# Patient Record
Sex: Male | Born: 1960 | Race: White | Hispanic: No | Marital: Married | Smoking: Never smoker
Health system: Southern US, Community
[De-identification: ages and names within clinical notes are randomized; demographics above are authoritative.]

## PROBLEM LIST (undated history)

## (undated) ENCOUNTER — Encounter

## (undated) ENCOUNTER — Telehealth

## (undated) ENCOUNTER — Ambulatory Visit

## (undated) ENCOUNTER — Encounter: Attending: Internal Medicine | Primary: Internal Medicine

## (undated) ENCOUNTER — Ambulatory Visit: Payer: PRIVATE HEALTH INSURANCE

## (undated) ENCOUNTER — Encounter: Attending: Oncology | Primary: Oncology

## (undated) ENCOUNTER — Encounter: Attending: Registered" | Primary: Registered"

## (undated) ENCOUNTER — Telehealth: Attending: Internal Medicine | Primary: Internal Medicine

## (undated) ENCOUNTER — Encounter: Payer: PRIVATE HEALTH INSURANCE | Attending: Audiologist | Primary: Audiologist

## (undated) ENCOUNTER — Ambulatory Visit: Payer: PRIVATE HEALTH INSURANCE | Attending: Registered" | Primary: Registered"

## (undated) ENCOUNTER — Telehealth: Attending: Hospitalist | Primary: Hospitalist

## (undated) ENCOUNTER — Inpatient Hospital Stay: Payer: PRIVATE HEALTH INSURANCE

## (undated) ENCOUNTER — Encounter: Payer: PRIVATE HEALTH INSURANCE | Attending: Internal Medicine | Primary: Internal Medicine

## (undated) ENCOUNTER — Inpatient Hospital Stay

## (undated) DIAGNOSIS — C801 Malignant (primary) neoplasm, unspecified: Secondary | ICD-10-CM

## (undated) DIAGNOSIS — K8020 Calculus of gallbladder without cholecystitis without obstruction: Secondary | ICD-10-CM

## (undated) MED ORDER — LORATADINE 10 MG TABLET: 0 days

---

## 2016-12-21 DIAGNOSIS — J069 Acute upper respiratory infection, unspecified: Secondary | ICD-10-CM | POA: Diagnosis not present

## 2016-12-21 DIAGNOSIS — R05 Cough: Secondary | ICD-10-CM | POA: Diagnosis not present

## 2017-02-21 DIAGNOSIS — I491 Atrial premature depolarization: Secondary | ICD-10-CM | POA: Diagnosis not present

## 2017-02-21 DIAGNOSIS — Z1389 Encounter for screening for other disorder: Secondary | ICD-10-CM | POA: Diagnosis not present

## 2018-03-31 DIAGNOSIS — J302 Other seasonal allergic rhinitis: Secondary | ICD-10-CM | POA: Diagnosis not present

## 2018-08-31 DIAGNOSIS — R1084 Generalized abdominal pain: Secondary | ICD-10-CM | POA: Diagnosis not present

## 2020-08-01 ENCOUNTER — Inpatient Hospital Stay (HOSPITAL_COMMUNITY)
Admission: EM | Admit: 2020-08-01 | Discharge: 2020-08-02 | DRG: 375 | Disposition: A | Discharge status: Other Acute Inpatient Hospital | Payer: BC Managed Care – PPO | Attending: Internal Medicine | Admitting: Internal Medicine

## 2020-08-01 ENCOUNTER — Emergency Department (HOSPITAL_COMMUNITY): Payer: BC Managed Care – PPO

## 2020-08-01 ENCOUNTER — Other Ambulatory Visit: Payer: Self-pay

## 2020-08-01 ENCOUNTER — Inpatient Hospital Stay (HOSPITAL_COMMUNITY): Payer: BC Managed Care – PPO

## 2020-08-01 ENCOUNTER — Encounter (HOSPITAL_COMMUNITY): Payer: Self-pay | Admitting: Emergency Medicine

## 2020-08-01 DIAGNOSIS — K589 Irritable bowel syndrome without diarrhea: Secondary | ICD-10-CM | POA: Diagnosis present

## 2020-08-01 DIAGNOSIS — K828 Other specified diseases of gallbladder: Secondary | ICD-10-CM | POA: Diagnosis present

## 2020-08-01 DIAGNOSIS — C189 Malignant neoplasm of colon, unspecified: Secondary | ICD-10-CM

## 2020-08-01 DIAGNOSIS — E860 Dehydration: Secondary | ICD-10-CM | POA: Diagnosis present

## 2020-08-01 DIAGNOSIS — K56609 Unspecified intestinal obstruction, unspecified as to partial versus complete obstruction: Secondary | ICD-10-CM | POA: Diagnosis present

## 2020-08-01 DIAGNOSIS — C7800 Secondary malignant neoplasm of unspecified lung: Secondary | ICD-10-CM | POA: Diagnosis present

## 2020-08-01 DIAGNOSIS — F419 Anxiety disorder, unspecified: Secondary | ICD-10-CM | POA: Diagnosis present

## 2020-08-01 DIAGNOSIS — Z79899 Other long term (current) drug therapy: Secondary | ICD-10-CM | POA: Diagnosis not present

## 2020-08-01 DIAGNOSIS — N179 Acute kidney failure, unspecified: Secondary | ICD-10-CM | POA: Diagnosis present

## 2020-08-01 DIAGNOSIS — Z20822 Contact with and (suspected) exposure to covid-19: Secondary | ICD-10-CM | POA: Diagnosis present

## 2020-08-01 DIAGNOSIS — I10 Essential (primary) hypertension: Secondary | ICD-10-CM | POA: Diagnosis present

## 2020-08-01 DIAGNOSIS — R590 Localized enlarged lymph nodes: Secondary | ICD-10-CM | POA: Diagnosis present

## 2020-08-01 DIAGNOSIS — K567 Ileus, unspecified: Secondary | ICD-10-CM

## 2020-08-01 DIAGNOSIS — C787 Secondary malignant neoplasm of liver and intrahepatic bile duct: Secondary | ICD-10-CM | POA: Diagnosis present

## 2020-08-01 HISTORY — DX: Calculus of gallbladder without cholecystitis without obstruction: K80.20

## 2020-08-01 HISTORY — DX: Malignant (primary) neoplasm, unspecified: C80.1

## 2020-08-01 LAB — CBC
HCT: 38.7 % — ABNORMAL LOW (ref 39.0–52.0)
Hemoglobin: 12.2 g/dL — ABNORMAL LOW (ref 13.0–17.0)
MCH: 27.9 pg (ref 26.0–34.0)
MCHC: 31.5 g/dL (ref 30.0–36.0)
MCV: 88.6 fL (ref 80.0–100.0)
Platelets: 674 10*3/uL — ABNORMAL HIGH (ref 150–400)
RBC: 4.37 MIL/uL (ref 4.22–5.81)
RDW: 13.8 % (ref 11.5–15.5)
WBC: 14.8 10*3/uL — ABNORMAL HIGH (ref 4.0–10.5)
nRBC: 0 % (ref 0.0–0.2)

## 2020-08-01 LAB — BASIC METABOLIC PANEL
Anion gap: 14 (ref 5–15)
BUN: 31 mg/dL — ABNORMAL HIGH (ref 6–20)
CO2: 26 mmol/L (ref 22–32)
Calcium: 8.9 mg/dL (ref 8.9–10.3)
Chloride: 93 mmol/L — ABNORMAL LOW (ref 98–111)
Creatinine, Ser: 1.18 mg/dL (ref 0.61–1.24)
GFR calc Af Amer: >60 mL/min (ref >60)
GFR calc non Af Amer: >60 mL/min (ref >60)
Glucose, Bld: 117 mg/dL — ABNORMAL HIGH (ref 70–99)
Potassium: 4.9 mmol/L (ref 3.5–5.1)
Sodium: 133 mmol/L — ABNORMAL LOW (ref 135–145)

## 2020-08-01 LAB — URINALYSIS, ROUTINE W REFLEX MICROSCOPIC
Glucose, UA: 100 mg/dL — AB
Hgb urine dipstick: NEGATIVE
Ketones, ur: 15 mg/dL — AB
Leukocytes,Ua: NEGATIVE
Nitrite: POSITIVE — AB
Protein, ur: 30 mg/dL — AB
Specific Gravity, Urine: 1.025 (ref 1.005–1.030)
pH: 5.5 (ref 5.0–8.0)

## 2020-08-01 LAB — COMPREHENSIVE METABOLIC PANEL
ALT: 27 U/L (ref 0–44)
AST: 39 U/L (ref 15–41)
Albumin: 2.5 g/dL — ABNORMAL LOW (ref 3.5–5.0)
Alkaline Phosphatase: 174 U/L — ABNORMAL HIGH (ref 38–126)
Anion gap: 18 — ABNORMAL HIGH (ref 5–15)
BUN: 44 mg/dL — ABNORMAL HIGH (ref 6–20)
CO2: 20 mmol/L — ABNORMAL LOW (ref 22–32)
Calcium: 8.5 mg/dL — ABNORMAL LOW (ref 8.9–10.3)
Chloride: 98 mmol/L (ref 98–111)
Creatinine, Ser: 1.29 mg/dL — ABNORMAL HIGH (ref 0.61–1.24)
GFR calc Af Amer: >60 mL/min (ref >60)
GFR calc non Af Amer: >60 mL/min (ref >60)
Glucose, Bld: 111 mg/dL — ABNORMAL HIGH (ref 70–99)
Potassium: 4.8 mmol/L (ref 3.5–5.1)
Sodium: 136 mmol/L (ref 135–145)
Total Bilirubin: 1.6 mg/dL — ABNORMAL HIGH (ref 0.3–1.2)
Total Protein: 5.8 g/dL — ABNORMAL LOW (ref 6.5–8.1)

## 2020-08-01 LAB — URINALYSIS, MICROSCOPIC (REFLEX)
RBC / HPF: NONE SEEN RBC/hpf (ref 0–5)
Squamous Epithelial / HPF: NONE SEEN (ref 0–5)

## 2020-08-01 LAB — CBG MONITORING, ED: Glucose-Capillary: 111 mg/dL — ABNORMAL HIGH (ref 70–99)

## 2020-08-01 LAB — CBC WITH DIFFERENTIAL/PLATELET
Abs Immature Granulocytes: 0.08 10*3/uL — ABNORMAL HIGH (ref 0.00–0.07)
Basophils Absolute: 0.1 10*3/uL (ref 0.0–0.1)
Basophils Relative: 0 %
Eosinophils Absolute: 0.1 10*3/uL (ref 0.0–0.5)
Eosinophils Relative: 0 %
HCT: 39.9 % (ref 39.0–52.0)
Hemoglobin: 12.5 g/dL — ABNORMAL LOW (ref 13.0–17.0)
Immature Granulocytes: 1 %
Lymphocytes Relative: 6 %
Lymphs Abs: 1 10*3/uL (ref 0.7–4.0)
MCH: 27.8 pg (ref 26.0–34.0)
MCHC: 31.3 g/dL (ref 30.0–36.0)
MCV: 88.9 fL (ref 80.0–100.0)
Monocytes Absolute: 2 10*3/uL — ABNORMAL HIGH (ref 0.1–1.0)
Monocytes Relative: 12 %
Neutro Abs: 13.3 10*3/uL — ABNORMAL HIGH (ref 1.7–7.7)
Neutrophils Relative %: 81 %
Platelets: 700 10*3/uL — ABNORMAL HIGH (ref 150–400)
RBC: 4.49 MIL/uL (ref 4.22–5.81)
RDW: 14.1 % (ref 11.5–15.5)
WBC Morphology: INCREASED
WBC: 16.5 10*3/uL — ABNORMAL HIGH (ref 4.0–10.5)
nRBC: 0 % (ref 0.0–0.2)

## 2020-08-01 LAB — HEMOGLOBIN A1C
Hgb A1c MFr Bld: 5.7 % — ABNORMAL HIGH (ref 4.8–5.6)
Mean Plasma Glucose: 116.89 mg/dL

## 2020-08-01 LAB — HIV ANTIBODY (ROUTINE TESTING W REFLEX): HIV Screen 4th Generation wRfx: NONREACTIVE

## 2020-08-01 LAB — SARS CORONAVIRUS 2 BY RT PCR (HOSPITAL ORDER, PERFORMED IN ~~LOC~~ HOSPITAL LAB): SARS Coronavirus 2: NEGATIVE

## 2020-08-01 MED ORDER — SODIUM CHLORIDE 0.9 % IV BOLUS
1000.0000 mL | Freq: Once | INTRAVENOUS | Status: AC
  Administered 2020-08-01: 1000 mL via INTRAVENOUS

## 2020-08-01 MED ORDER — SODIUM CHLORIDE 0.9% FLUSH
3.0000 mL | Freq: Two times a day (BID) | INTRAVENOUS | Status: DC
  Administered 2020-08-02: 3 mL via INTRAVENOUS

## 2020-08-01 MED ORDER — ONDANSETRON HCL 4 MG/2ML IJ SOLN
4.0000 mg | Freq: Once | INTRAMUSCULAR | Status: AC
  Administered 2020-08-01: 4 mg via INTRAVENOUS
  Filled 2020-08-01: qty 2

## 2020-08-01 MED ORDER — LACTATED RINGERS IV SOLN: INTRAVENOUS | Status: DC

## 2020-08-01 MED ORDER — LACTATED RINGERS IV BOLUS
1000.0000 mL | Freq: Once | INTRAVENOUS | Status: AC
  Administered 2020-08-01: 1000 mL via INTRAVENOUS

## 2020-08-01 MED ORDER — HEPARIN SODIUM (PORCINE) 5000 UNIT/ML IJ SOLN
5000.0000 [IU] | Freq: Three times a day (TID) | INTRAMUSCULAR | Status: DC
  Administered 2020-08-01 – 2020-08-02 (×3): 5000 [IU] via SUBCUTANEOUS
  Filled 2020-08-01 (×3): qty 1

## 2020-08-01 NOTE — H&P (Addendum)
 Date:                Patient Name:  Seth Hughes MRN: [id number]  DOB: 06-Jan-1961 Age / Sex: 59 y.o., male   PCP: Myrlene Broker, MD         Medical Service: Internal Medicine Teaching Service         Attending Physician: Dr. Jimmye Norman, Elaina Pattee, MD    First Contact: Dr. Candie Chroman Pager: [phone]  Second Contact: Dr. Marianna Payment Pager: [phone]       After Hours (After 5p/  First Contact Pager: [phone]  weekends / holidays): Second Contact Pager: [phone]   Chief Complaint: abdominal pain  History of Present Illness:  Seth Hughes is 60yo male with hypertension, history of nephtrolithiasis presenting with abdominal pain and poor po intake. Patient states lower and left-sided abdominal pain started on Sunday. At the time he contributed pain to another kidney stone. He saw his urologist in [place] [facility]) on Tuesday, who ordered labs and imaging. Patient reports pain continued to worsen and was unable to tolerate food on Tuesday afternoon. Mentions he was able to drink, but had episode of emesis immediately after eating. He says he felt nauseous whenever he attempted to eat anything, even ice. The next day patient reports diffuse abdominal pain and swelling and was unable to tolerate any po intake. States he had CT scan that day and was told he had a mass in his colon. He was referred to a surgeon the next day, who told him he needed a stent and biopsy. Patient and wife [name]) mention they have been waiting for a call to schedule the appointment with Dr. Ardis Hughs.  Patient reports symptoms have continued to worsen and now endorses difficulty breathing and hiccups. He denies SOB but says the pain makes it difficult. Also describes back pain, which he contributes poor posture due to abdominal pain and distention. States he has been dry heaving and his nausea is now constant. Last BM last weekend, reports 1 flatus earlier this week. Mentions his urine is very dark, which he  contributes to lack of food, drink. Patient's wife also states he has been very warm this week, which is unusual for him. Denies CP, dysuria, significant weight loss.   In ED, patient afebrile, tachycardic. Labs notable for leukocytosis. KUB revealed obstruction in distal colon with possible small bowel obstruction. Patient given 1L bolus. IMTS consulted for admission for colonic obstruction and dehydration.   Past Medical History:  Diagnosis Date  . Cancer ([place])   . Gall stones    Meds:  Current Meds  Medication Sig  . citalopram (CELEXA) 40 MG tablet Take 40 mg by mouth daily with supper.  . docusate sodium (COLACE) 100 MG capsule Take 100 mg by mouth 2 (two) times daily.  Marland Kitchen LORazepam (ATIVAN) 0.5 MG tablet Take 0.25 mg by mouth every 8 (eight) hours as needed for anxiety (stress).   . Multiple Vitamin (MULTIVITAMIN WITH MINERALS) TABS tablet Take 1 tablet by mouth daily with breakfast.  . ondansetron (ZOFRAN-ODT) 4 MG disintegrating tablet Take 4 mg by mouth every 6 (six) hours as needed for nausea or vomiting.   Marland Kitchen oxyCODONE (OXY IR/ROXICODONE) 5 MG immediate release tablet Take 5 mg by mouth every 6 (six) hours as needed (pain).   . tamsulosin (FLOMAX) 0.4 MG CAPS capsule Take 0.4 mg by mouth daily after supper.   Allergies: Allergies as of   . (Not on File)   Family History:  Father: Diabetes, CAD Mother: Ovarian cancer  Social History:  Pt is able to complete his own ADLs/IADLs at baseline. Lives in [place] with his wife, [name], and has 1 son.  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 Denies current or past tobacco, alcohol or drug use.   Review of Systems: A complete ROS was negative except as per HPI.   Physical Exam: Blood pressure (!) 155/101, pulse (!) 130, temperature 99.2 F (37.3 C), temperature source Oral, resp. rate (!) 24, SpO2 97 %. Physical Exam Constitutional:      General: He is not in acute distress.    Appearance: He is ill-appearing.   HENT:     Head: Normocephalic and atraumatic.     Mouth/Throat:     Mouth: Mucous membranes are dry.  Eyes:     General: Lids are normal. No scleral icterus. Cardiovascular:     Rate and Rhythm: Regular rhythm. Tachycardia present.     Pulses: Normal pulses.     Heart sounds: Normal heart sounds.  Pulmonary:     Effort: Tachypnea present.     Breath sounds: Normal breath sounds.  Abdominal:     General: Bowel sounds are normal.     Tenderness: There is no abdominal tenderness.     Comments: Abdomen is firm, distended.  Musculoskeletal:     Right lower leg: No edema.     Left lower leg: No edema.  Skin:    General: Skin is warm and moist.  Neurological:     General: No focal deficit present.     Mental Status: He is alert and oriented to person, place, and time.  Psychiatric:        Speech: Speech normal.        Behavior: Behavior is cooperative.        Cognition and Memory: Cognition normal.    CBC Latest Ref Rng & Units    WBC 4.0 - 10.5 K/uL 16.5(H) 14.8(H)  Hemoglobin 13.0 - 17.0 g/dL 12.5(L) 12.2(L)  Hematocrit 39 - 52 % 39.9 38.7(L)  Platelets 150 - 400 K/uL 700(H) 674(H)   CMP Latest Ref Rng & Units    Glucose 70 - 99 mg/dL 111(H) 117(H)  BUN 6 - 20 mg/dL 44(H) 31(H)  Creatinine 0.61 - 1.24 mg/dL 1.29(H) 1.18  Sodium 135 - 145 mmol/L 136 133(L)  Potassium 3.5 - 5.1 mmol/L 4.8 4.9  Chloride 98 - 111 mmol/L 98 93(L)  CO2 22 - 32 mmol/L 20(L) 26  Calcium 8.9 - 10.3 mg/dL 8.5(L) 8.9  Total Protein 6.5 - 8.1 g/dL 5.8(L) -  Total Bilirubin 0.3 - 1.2 mg/dL 1.6(H) -  Alkaline Phos 38 - 126 U/L 174(H) -  AST 15 - 41 U/L 39 -  ALT 0 - 44 U/L 27 -   Urinalysis    Component Value Date/Time   COLORURINE AMBER (A)  1204   APPEARANCEUR CLEAR  1204   LABSPEC 1.025  1204   PHURINE 5.5  1204   GLUCOSEU 100 (A)  1204   HGBUR NEGATIVE  1204   BILIRUBINUR MODERATE (A)   1204   KETONESUR 15 (A)  1204   PROTEINUR 30 (A)  1204   NITRITE POSITIVE (A)  1204   LEUKOCYTESUR NEGATIVE  1204   EKG: personally reviewed my interpretation is sinus tachycardia  CXR: personally reviewed my interpretation is bilateral focal infiltrates, likely metastasis  KUB:  IMPRESSION: 1. Evidence of distal colonic obstruction. There is now a degree of small-bowel dilatation as well  which may be due to bowel obstruction or a degree of secondary ileus. No free air.  Assessment & Plan by Problem: Active Problems:   Colonic obstruction [facility])  Mr. Kneeland is 59yo male with hypertension, history of nephrolithiasis admitted for colonic obstruction 2/2 newly-found mass concerning for metastatic colon cancer.   #Colonic obstruction Patient began having abdominal pain Sunday, which has worsened throughout this week and associated with distension. He now is not tolerating po intake due to nausea and vomiting. Last BM was 1 week ago and had only 1 episode of flatus earlier this week.  Patient had [facility] scan at [facility] on 9/8, which revealed apple-core lesion in rectosigmoid with proximal colonic dilation.  Patient was waiting to schedule an appointment with Dr. Ardis Hughs for colonic stent and biopsy. On arrival, KUB showed distal colonic obstruction complicated by small-bowel dilation 2/2 either bowel obstruction or secondary ileus. ED provider discussed case with GI, who plans on seeing patient and evaluating for stent placement/biopsy. On exam, abodmen markedly extended and firm. Starting NGT, discussed with patient importance of notifying staff of any changes due to risk of perforation. Expect difficulty of breathing and hiccups to improve with decompression. BCx drawn, holding abx for now. Patient reports feeling warm all week, but has been afebrile since arrival. Tachycardia most likely 2/2 dehydration. Leukocytosis 2/2 inflammation d/t obstruction v  infection. Patient denies urinary symptoms. Low threshold for antibiotic therapy.  -NGT -NPO -GI to evaluate -BCx pending -CBC daily  #Acute Kidney Injury #Dehydration Patient has had poor po intake throughout the week d/t nausea, vomiting. Reports dark urine and decreased UOP throughout the week, denies dysuria.  CT 9/8 revealed non-obstructing bilateral nephrolithiasis. On arrival, patient afebrile, tachycardic. Cr 1.18 -> 1.29 (unknown BL). UA revealed ketones, bilirubin, nitrite, glucose. Most likely pre-renal AKI. Received 1L bolus in ED, will give another !L bolus followed by continuous infusion. -IVF infusion -BMP daily  #AG Metabolic Acidosis On arrival AG 14->18. Patient markedly dehydrated without po intake for last few days. UA w/ ketones, likely contributing. Lactic pending.  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 Patient denies recent weight loss, family hx of colon cancer. Last colonoscopy 09/2010. CT 9/8 revealed apple-core type lesion in rectosigmoid, multiple hypodense lesions in liver consistent with metastatic disease, and innumerable pulmonary nodules consistent with metastatic disease. Labs on arrival revealed elevated mild anemia, thrombocytosis, alk phos, bilirubinemia, bilirubinuria. GI on board per ED provider, will evaluate for colonic stent and biopsy. Will likely need oncology consult. Given severity and extent of disease, palliative consult appropriate for advance directive needs (none on file) and future goals for treatment plans.  -GI to evaluate -Can consider oncology, palliative consult  #Hypertension Patient previously diagnosed with hypertension, reports taking metoprolol daily, states this is well controlled. On arrival, SBP 140-150's.  -Holding metoprolol   Dispo: Admit patient to Inpatient with  expected length of stay greater than 2 midnights. Diet: NPO IVF: LR 100/hr DVT PPX: heparin Code Status: Full  Signed: Sanjuan Dame, MD , 12:23 AM  Pager: [phone] After 5pm on weekdays and 1pm on weekends: On Call pager: [phone]

## 2020-08-01 NOTE — ED Notes (Signed)
Patient is resting comfortably. Family by bedside  

## 2020-08-01 NOTE — ED Provider Notes (Signed)
 [facility] EMERGENCY DEPARTMENT Provider Note   CSN: [id number] Arrival date & time:   6256     History Chief Complaint  Patient presents with  . Constipation  . Nausea  . Fatigue    Seth Hughes is a 59 y.o. male.  HPI Presents for evaluation of worsening abdominal bloating with pressure sensation.  He has nausea, and belching, but no vomiting.  He has decreased stool output.  Seen 2 days ago by general surgery who is on scheduling colonoscopy for placement of a colon stent, to treat colon cancer which has been diagnosed as metastatic to liver.  This is a new diagnosis of the patient, 3 days ago.  Denies fever or chills.  He has not had a Covid vaccine.  He had some flatus yesterday, none today.  Last bowel movement was 5 days ago.  There are no other known factors.    Past Medical History:  Diagnosis Date  . Cancer ([place])   . Gall stones     There are no problems to display for this patient.   History reviewed. No pertinent surgical history.     No family history on file.  Social History   Tobacco Use  . Smoking status: Never Smoker  . Smokeless tobacco: Never Used  Substance Use Topics  . Alcohol use: Not Currently  . Drug use: Not Currently    Home Medications Prior to Admission medications   Not on File    Allergies    Patient has no allergy information on record.  Review of Systems   Review of Systems  All other systems reviewed and are negative.   Physical Exam Updated Vital Signs BP (!) 155/101   Pulse (!) 130   Temp 99.2 F (37.3 C) (Oral)   Resp (!) 24   SpO2 97%   Physical Exam Vitals and nursing note reviewed.  Constitutional:      General: He is not in acute distress.    Appearance: He is well-developed. He is not ill-appearing, toxic-appearing or diaphoretic.  HENT:     Head: Normocephalic and atraumatic.     Right Ear: External ear normal.     Left Ear: External ear normal.  Eyes:      Conjunctiva/sclera: Conjunctivae normal.     Pupils: Pupils are equal, round, and reactive to light.  Neck:     Trachea: Phonation normal.  Cardiovascular:     Rate and Rhythm: Normal rate and regular rhythm.     Heart sounds: Normal heart sounds.  Pulmonary:     Effort: Pulmonary effort is normal.     Breath sounds: Normal breath sounds.  Abdominal:     General: There is distension.     Palpations: Abdomen is soft.     Tenderness: There is abdominal tenderness (Diffuse, mild .).  Musculoskeletal:        General: Normal range of motion.     Cervical back: Normal range of motion and neck supple.  Skin:    General: Skin is warm and dry.  Neurological:     Mental Status: He is alert and oriented to person, place, and time.     Cranial Nerves: No cranial nerve deficit.     Sensory: No sensory deficit.     Motor: No abnormal muscle tone.     Coordination: Coordination normal.  Psychiatric:        Mood and Affect: Mood normal.        Behavior: Behavior normal.  Thought Content: Thought content normal.        Judgment: Judgment normal.     ED Results / Procedures / Treatments   Labs (all labs ordered are listed, but only abnormal results are displayed) Labs Reviewed  BASIC METABOLIC PANEL - Abnormal; Notable for the following components:      Result Value   Sodium 133 (*)    Chloride 93 (*)    Glucose, Bld 117 (*)    BUN 31 (*)    All other components within normal limits  CBC - Abnormal; Notable for the following components:   WBC 14.8 (*)    Hemoglobin 12.2 (*)    HCT 38.7 (*)    Platelets 674 (*)    All other components within normal limits  URINALYSIS, ROUTINE W REFLEX MICROSCOPIC - Abnormal; Notable for the following components:   Color, Urine AMBER (*)    Glucose, UA 100 (*)    Bilirubin Urine MODERATE (*)    Ketones, ur 15 (*)    Protein, ur 30 (*)    Nitrite POSITIVE (*)    All other components within normal limits  URINALYSIS, MICROSCOPIC (REFLEX) -  Abnormal; Notable for the following components:   Bacteria, UA FEW (*)    All other components within normal limits  CBG MONITORING, ED - Abnormal; Notable for the following components:   Glucose-Capillary 111 (*)    All other components within normal limits  SARS CORONAVIRUS 2 BY RT PCR (HOSPITAL ORDER, [place] [facility])  URINE CULTURE    EKG EKG Interpretation  Date/Time:  Saturday August 01 2020 07:31:03 EDT Ventricular Rate:  130 PR Interval:  146 QRS Duration: 74 QT Interval:  304 QTC Calculation: 447 R Axis:   52 Text Interpretation: Sinus tachycardia Nonspecific ST and T wave abnormality Abnormal ECG No old tracing to compare Confirmed by Daleen Bo [phone]) on  11:44:10 AM   Radiology DG ABD ACUTE 2+V W 1V CHEST  Result Date:  CLINICAL DATA:  Abdominal bloating EXAM: ACUTE ABDOMEN SERIES (2 VIEW ABDOMEN AND 1 VIEW CHEST) COMPARISON:  Abdomen radiograph July 28, 2020; CT abdomen and pelvis July 29, 2020 FINDINGS: PA chest: Multiple nodular opacities consistent with parenchymal lung metastases noted. No edema or airspace opacity. Heart size and pulmonary vascularity are normal. No adenopathy appreciable. Supine and upright abdomen: There remains somewhat generalized bowel dilatation consistent with distal colonic obstruction. There may be a secondary degree of ileus. No free air. No abnormal calcifications. IMPRESSION: 1. Evidence of distal colonic obstruction. There is now a degree of small-bowel dilatation as well which may be due to bowel obstruction or a degree of secondary ileus. No free air. 2. Extensive pulmonary metastases with multiple nodular lesions throughout the lungs. No edema or airspace opacity. Note that recent CT examination showed finding strongly suspicious for colon carcinoma distally. Electronically Signed   By: Lowella Grip III M.D.   On:  13:15    Procedures Procedures (including critical  care time)  Medications Ordered in ED Medications  ondansetron (ZOFRAN) injection 4 mg (4 mg Intravenous Given  1149)  sodium chloride 0.9 % bolus 1,000 mL (0 mLs Intravenous Stopped  1447)    ED Course  I have reviewed the triage vital signs and the nursing notes.  Pertinent labs & imaging results that were available during my care of the patient were reviewed by me and considered in my medical decision making (see chart for details).  Clinical Course as of  Aug 01 1914  Sat Aug 01, 2020  1319 Normal except sodium low, chloride low, glucose high, BUN high  Basic metabolic panel(!) [EW]  0932 Normal except white count high, hemoglobin low, platelets high  CBC(!) [EW]  1629 Case discussed with Dr. Johney Maine, on-call general surgeon at [facility].  She feels like the patient likely needs acute treatment, with a stent placement.  There are currently no beds at that facility, therefore the patient cannot be accepted there.   [EW]  1638 Abnormal, presence of glucose, bilirubin, ketones, protein and nitrite  Urinalysis, Routine w reflex microscopic Urine, Clean Catch(!) [EW]  1638 Abnormal, few bacteria present  Urinalysis, Microscopic (reflex)(!) [EW]  1638 Normal  SARS Coronavirus 2 by RT PCR (hospital order, performed in [facility] hospital lab) Nasopharyngeal Urine, Clean Catch [EW]  1639 Persistent colonic dilatation, now with small bowel distention, ileus versus small bowel obstruction  DG ABD ACUTE 2+V W 1V CHEST [EW]  1743 Case discussed with on-call GI, Dr. Collene Mares.  She will see the patient as a Optometrist and request that the patient be admitted to the medical service.   [EW]  1857 Sodium(!): 133 [EW]    Clinical Course User Index [EW] Daleen Bo, MD   MDM Rules/Calculators/A&P                           Patient Vitals for the past 24 hrs:  BP Temp Temp src Pulse Resp SpO2   1900 (!) 155/101 -- -- (!) 130 (!) 24 97 %   1745 (!)  147/100 -- -- (!) 127 (!) 21 96 %   1700 (!) 142/103 -- -- (!) 132 (!) 21 96 %   1600 (!) 142/106 -- -- (!) 129 (!) 21 96 %   1545 (!) 148/103 -- -- (!) 135 (!) 23 97 %   1530 (!) 150/99 -- -- (!) 123 (!) 22 96 %   1445 (!) 152/107 -- -- (!) 125 (!) 24 97 %   1430 (!) 149/101 -- -- (!) 119 (!) 21 97 %   1215 (!) 156/100 -- -- (!) 102 17 98 %   1200 (!) 154/99 -- -- (!) 109 17 97 %   1145 (!) 154/97 -- -- (!) 116 17 97 %   1034 (!) 153/99 99.2 F (37.3 C) Oral (!) 116 14 96 %   0724 (!) 142/98 98.8 F (37.1 C) Oral (!) 142 16 99 %    6:57 PM Reevaluation with update and discussion. After initial assessment and treatment, an updated evaluation reveals he continues to have hiccups abdominal bloating.  Findings discussed with patient and wife, all questions were answered. Daleen Bo   Medical Decision Making:  This patient is presenting for evaluation of abdominal distention, which does require a range of treatment options, and is a complaint that involves a high risk of morbidity and mortality. The differential diagnoses include bowel obstruction, visceral perforation,. I decided to review old records, and in summary patient with recently diagnosed with metastatic colon cancer, including apical colon lesion.  He has been evaluated with plans made for rectal stenting, but that has not been arranged yet..  I did not require additional historical information from anyone.  Clinical Laboratory Tests Ordered, included CBC and Metabolic panel. Review indicates abnormal urinalysis, elevated white count, mild azotemia, slightly low chloride, and sodium.. Radiologic Tests Ordered, included three-way abdomen.  I independently Visualized: Radiographic images, which show  persistent colonic ileus with possible early small bowel obstruction versus ileus  Cardiac Monitor Tracing which shows sinus  tachycardia   Critical Interventions-clinical evaluation, laboratory testing, radiographic imaging, observation reassessment  I discussed the case with Dr. Johney Maine, on-call general surgeon at [facility] health.  They are unable to accept the patient for further care and treatment because there are no beds at their facility.  I contacted gastroenterology here, Dr. Collene Mares who agrees to see the patient as a Optometrist, starting tomorrow morning.  She recommends admit the patient to a hospitalist service.  After These Interventions, the Patient was reevaluated and was found to require hospitalization.  No overt signs for bowel obstruction, belching without vomiting.  If the patient begins vomiting he will have to have a NG placed.  Will treat symptomatically and follow closely.  Anticipate patient stay n.p.o. for rectal stenting within the next day or 2.  No strong evidence for other significant abnormalities.  Possible UTI.  Urine culture ordered.  He does not currently have urinary tract symptoms.    Nursing Notes Reviewed/ Care Coordinated, and agree without changes. Applicable Imaging Reviewed.  Interpretation of Laboratory Data incorporated into ED treatment acute abnormalities.  CRITICAL CARE-no Performed by: Daleen Bo  Nursing Notes Reviewed/ Care Coordinated Applicable Imaging Reviewed Interpretation of Laboratory Data incorporated into ED treatment   7:15 PM-case discussed with admitting teaching service resident.  They will see the patient for admission.  Plan: Admit  Final Clinical Impression(s) / ED Diagnoses Final diagnoses:  Metastatic colon cancer in male [facility])  [facility])    Rx / DC Orders ED Discharge Orders    None       Daleen Bo, MD  [id number]

## 2020-08-01 NOTE — ED Notes (Signed)
Patient is resting comfortably. 

## 2020-08-01 NOTE — ED Triage Notes (Signed)
Pt. Stated, Ive not used the BR since Monday, I have a mass over my intestines. Im having nausea. I see Dr. Ardis Hughs. Im really tired .

## 2020-08-02 ENCOUNTER — Ambulatory Visit
Admit: 2020-08-02 | Discharge: 2020-08-05 | Disposition: A | Discharge status: Home / Self Care | Payer: PRIVATE HEALTH INSURANCE | Source: Other Acute Inpatient Hospital

## 2020-08-02 ENCOUNTER — Encounter
Admit: 2020-08-02 | Discharge: 2020-08-05 | Disposition: A | Discharge status: Home / Self Care | Payer: PRIVATE HEALTH INSURANCE | Source: Other Acute Inpatient Hospital

## 2020-08-02 LAB — CBC WITH DIFFERENTIAL/PLATELET
Abs Immature Granulocytes: 0.11 10*3/uL — ABNORMAL HIGH (ref 0.00–0.07)
Basophils Absolute: 0 10*3/uL (ref 0.0–0.1)
Basophils Relative: 0 %
Eosinophils Absolute: 0 10*3/uL (ref 0.0–0.5)
Eosinophils Relative: 0 %
HCT: 37.8 % — ABNORMAL LOW (ref 39.0–52.0)
Hemoglobin: 12.2 g/dL — ABNORMAL LOW (ref 13.0–17.0)
Immature Granulocytes: 1 %
Lymphocytes Relative: 6 %
Lymphs Abs: 1 10*3/uL (ref 0.7–4.0)
MCH: 28.8 pg (ref 26.0–34.0)
MCHC: 32.3 g/dL (ref 30.0–36.0)
MCV: 89.2 fL (ref 80.0–100.0)
Monocytes Absolute: 1.9 10*3/uL — ABNORMAL HIGH (ref 0.1–1.0)
Monocytes Relative: 11 %
Neutro Abs: 14.9 10*3/uL — ABNORMAL HIGH (ref 1.7–7.7)
Neutrophils Relative %: 82 %
Platelets: 726 10*3/uL — ABNORMAL HIGH (ref 150–400)
RBC: 4.24 MIL/uL (ref 4.22–5.81)
RDW: 14.3 % (ref 11.5–15.5)
WBC Morphology: INCREASED
WBC: 17.9 10*3/uL — ABNORMAL HIGH (ref 4.0–10.5)
nRBC: 0 % (ref 0.0–0.2)

## 2020-08-02 LAB — COMPREHENSIVE METABOLIC PANEL
ALT: 26 U/L (ref 0–44)
AST: 37 U/L (ref 15–41)
Albumin: 2.3 g/dL — ABNORMAL LOW (ref 3.5–5.0)
Alkaline Phosphatase: 167 U/L — ABNORMAL HIGH (ref 38–126)
Anion gap: 15 (ref 5–15)
BUN: 43 mg/dL — ABNORMAL HIGH (ref 6–20)
CO2: 23 mmol/L (ref 22–32)
Calcium: 8.3 mg/dL — ABNORMAL LOW (ref 8.9–10.3)
Chloride: 98 mmol/L (ref 98–111)
Creatinine, Ser: 1.1 mg/dL (ref 0.61–1.24)
GFR calc Af Amer: >60 mL/min (ref >60)
GFR calc non Af Amer: >60 mL/min (ref >60)
Glucose, Bld: 118 mg/dL — ABNORMAL HIGH (ref 70–99)
Potassium: 4.7 mmol/L (ref 3.5–5.1)
Sodium: 136 mmol/L (ref 135–145)
Total Bilirubin: 1.5 mg/dL — ABNORMAL HIGH (ref 0.3–1.2)
Total Protein: 5.4 g/dL — ABNORMAL LOW (ref 6.5–8.1)

## 2020-08-02 LAB — LACTIC ACID, PLASMA: Lactic Acid, Venous: 1.5 mmol/L (ref 0.5–1.9)

## 2020-08-02 LAB — LACTATE DEHYDROGENASE: LDH: 331 U/L — ABNORMAL HIGH (ref 98–192)

## 2020-08-02 LAB — URINE CULTURE: Culture: <10000 — AB

## 2020-08-02 MED ORDER — ONDANSETRON HCL 4 MG/2ML IJ SOLN
4.0000 mg | Freq: Three times a day (TID) | INTRAMUSCULAR | Status: DC | PRN
  Administered 2020-08-02 (×2): 4 mg via INTRAVENOUS
  Filled 2020-08-02 (×2): qty 2

## 2020-08-02 MED ORDER — LACTATED RINGERS IV BOLUS
1000.0000 mL | Freq: Once | INTRAVENOUS | Status: AC
  Administered 2020-08-02: 1000 mL via INTRAVENOUS

## 2020-08-02 MED ORDER — KETOROLAC TROMETHAMINE 30 MG/ML IJ SOLN
30.0000 mg | Freq: Three times a day (TID) | INTRAMUSCULAR | Status: DC | PRN
  Administered 2020-08-02: 30 mg via INTRAVENOUS
  Filled 2020-08-02: qty 1

## 2020-08-02 NOTE — Progress Notes (Signed)
Notified pt has a bed at [place] unavailable for transport at this time  PTAR set up for transport to [facility] MD aware and approved PTAR for transport  Report called to [place] at [facility], [phone] opt 2 Pt and pt wife updated at bedside on plan of care

## 2020-08-02 NOTE — Hospital Course (Addendum)
Seth Hughes is 59yo male with hypertension, history of nephtrolithiasis presenting with abdominal pain and poor po intake. Patient states abdominal pain started earlier this week on Sunday and at the time contributed pain to another kidney stone. Mentions pain located in lower and left side of abdomen. He saw his urologist in [facility]) on Tuesday, who ordered labs and imaging. Patient reports pain continued to worsen and was unable to tolerate food on Tuesday afternoon. Mentions he was able to drink, but had episode of emesis immediately after eating. He says he felt nauseous whenever he attempted to eat anything, even ice. The next day patient reports diffuse abdominal pain and swelling and was unable to tolerate any po intake. States he had CT scan that day and was told he had a mass in his colon. He was referred to a surgeon the next day, who told him he needed a stent and biopsy. Patient and wife Seth Hughes) mention they are waiting for a call to schedule the appointment with Dr. Ardis Hughs.  Patient reports symptoms have continued to worsen and now endorses difficulty breathing and hiccups. He denies SOB but says the pain makes it difficult. Also describes back pain, which he contributes poor posture due to abdominal pain and distention. States he has been dry heaving and his nausea is now constant. Last BM last weekend, reports 1 flatus earlier this week. Mentions his urine is very dark, which he contributes to lack of food, drink. Patient's wife also states he has been very warm this week, which is unusual for him. Denies CP, dysuria, significant weight loss.    In ED, patient afebrile, tachycardic. Labs notable for leukocytosis,    Seth Hughes is 59yo male with hypertension, history of nephrolithiasis admitted for colonic obstruction 2/2 newly-found mass concerning for metastatic colon cancer.    #Colonic obstruction Likely metastatic colon carcinoma XR Abdominal series noted evidence of distal  colonic obstruction with a degree of small-bowel-dilatation likely 2/2 bowel obstruction or a degree of secondary ileus. No free air was noted. Extensive mets to the Lungs with multiple nodular lesions noted.  Admitting team noted that a CT 9/8 was significant for apple-core type lesion in rectosigmoid, multiple hypodense lesions in liver consistent with metastatic disease, and innumerable pulmonary nodules consistent with metastatic disease. Patient was having signifcant nasuea and vomiting and unable to have PO intake. NG tube was placed and provided relief. Patient kept NPO. Patient was also provided Zofran and  Toradol PRN for pain. Consulted GI who recommended diverting colostomy. GI did not feel that a stent would hold well in the patient.   Acute Kidney Injury lilely 2/2 Dehydration Patient received 2L LR bolus and had LR mIVF.  Trended BUN and Cr. Noted decrease in both values after fluids provided.   #Hypertension Held patient home metoprolol. BP range 142/98-157/103 likely 2/2 to pain and dehydration with history of HTN.

## 2020-08-02 NOTE — Consult Note (Signed)
 UNASSIGNED PATIENT-CROSS COVER LHC-GI Reason for Consult: Metastatic colon cancer. Referring Physician: Dr. Daleen Bo, ER, MD  Seth Hughes is an 59 y.o. male.  HPI: Mr. Seth Hughes is a 59 year old white male who was comes to [facility] from [place], [place].  He has a history of nephrolithiasis and with an arrhythmia requiring treatment with metoprolol and a history of anxiety disorder for which he takes Lorazepam.  He denies having any prior history of hypertension.  He claims he was in his baseline state of health till about 3 months ago when he started having some abdominal discomfort which he thought was secondary to kidney stones.  He apparently went to see his PCP and was diagnosed with IBS.  As per the patient the previous PCP advised him to have a left teas and other labs checked but the patient refused to do so as he had several bills in the past he was not able to pay.  Last week he saw his urologist who ordered some lab work and an abdominal CT that revealed an apple core lesion in the rectosigmoid colon with multiple liver and pulmonary mets and multiple enlarged retroperitoneal lymph nodes compatible with metastatic disease from a primary colon cancer.  Patient was brought to [facility] with plans for possible stent to relieve the bowel obstruction symptoms that he presented with.  Over the last few weeks he has had worsening abdominal distention and abdominal pain with nausea and when he got to the emergency room at [facility] he was decompressed with an NG tube which is helped her symptoms greatly. He denies a history of melena hematochezia or abnormal weight loss. He has had a fair appetite.  He denies a family history of colon cancer. He is never had a colonoscopy as he felt he never needed to have one done as he had no GI complaints.  However since the symptoms started he has had worsening constipation has not had a bowel movement in the last week and after the last for the  last couple of days has not been able to pass any flatus.  KUB done in the ER revealed a distal colonic obstruction with dilated loops of small bowel.  Past Medical History:  Diagnosis Date  . Cancer ([place])   . Gall stones    History reviewed. No pertinent surgical history.  No family history on file.  Social History:  reports that he has never smoked. He has never used smokeless tobacco. He reports previous alcohol use. He reports previous drug use.  Allergies: No Known Allergies  Medications: I have reviewed the patient's current medications.  Results for orders placed or performed during the hospital encounter of  (from the past 48 hour(s))  Basic metabolic panel     Status: Abnormal   Collection Time:   7:48 AM  Result Value Ref Range   Sodium 133 (L) 135 - 145 mmol/L   Potassium 4.9 3.5 - 5.1 mmol/L   Chloride 93 (L) 98 - 111 mmol/L   CO2 26 22 - 32 mmol/L   Glucose, Bld 117 (H) 70 - 99 mg/dL    Comment: Glucose reference range applies only to samples taken after fasting for at least 8 hours.   BUN 31 (H) 6 - 20 mg/dL   Creatinine, Ser 1.18 0.61 - 1.24 mg/dL   Calcium 8.9 8.9 - 10.3 mg/dL   GFR calc non Af Amer >60 >60 mL/min   GFR calc Af Amer >60 >60 mL/min  Anion gap 14 5 - 15    Comment: Performed at [facility] [address] [place], [place] [address]  CBC     Status: Abnormal   Collection Time:   7:48 AM  Result Value Ref Range   WBC 14.8 (H) 4.0 - 10.5 K/uL   RBC 4.37 4.22 - 5.81 MIL/uL   Hemoglobin 12.2 (L) 13.0 - 17.0 g/dL   HCT 38.7 (L) 39 - 52 %   MCV 88.6 80.0 - 100.0 fL   MCH 27.9 26.0 - 34.0 pg   MCHC 31.5 30.0 - 36.0 g/dL   RDW 13.8 11.5 - 15.5 %   Platelets 674 (H) 150 - 400 K/uL   nRBC 0.0 0.0 - 0.2 %    Comment: Performed at [facility], [place] [address] [place], [place] [address]  CBG monitoring, ED     Status: Abnormal   Collection Time:  11:28 AM  Result Value Ref Range   Glucose-Capillary 111  (H) 70 - 99 mg/dL    Comment: Glucose reference range applies only to samples taken after fasting for at least 8 hours.  Urinalysis, Routine w reflex microscopic Urine, Clean Catch     Status: Abnormal   Collection Time:  12:04 PM  Result Value Ref Range   Color, Urine AMBER (A) YELLOW    Comment: BIOCHEMICALS MAY BE AFFECTED BY COLOR   APPearance CLEAR CLEAR   Specific Gravity, Urine 1.025 1.005 - 1.030   pH 5.5 5.0 - 8.0   Glucose, UA 100 (A) NEGATIVE mg/dL   Hgb urine dipstick NEGATIVE NEGATIVE   Bilirubin Urine MODERATE (A) NEGATIVE   Ketones, ur 15 (A) NEGATIVE mg/dL   Protein, ur 30 (A) NEGATIVE mg/dL   Nitrite POSITIVE (A) NEGATIVE   Leukocytes,Ua NEGATIVE NEGATIVE    Comment: Performed at [facility] [address], [place], [place] [address]  SARS Coronavirus 2 by RT PCR (hospital order, performed in [facility] hospital lab) Nasopharyngeal Urine, Clean Catch     Status: None   Collection Time:  12:04 PM   Specimen: Urine, Clean Catch; Nasopharyngeal  Result Value Ref Range   SARS Coronavirus 2 NEGATIVE NEGATIVE    Comment: (NOTE) SARS-CoV-2 target nucleic acids are NOT DETECTED.  The SARS-CoV-2 RNA is generally detectable in upper and lower respiratory specimens during the acute phase of infection. The lowest concentration of SARS-CoV-2 viral copies this assay can detect is 250 copies / mL. A negative result does not preclude SARS-CoV-2 infection and should not be used as the sole basis for treatment or other patient management decisions.  A negative result may occur with improper specimen collection / handling, submission of specimen other than nasopharyngeal swab, presence of viral mutation(s) within the areas targeted by this assay, and inadequate number of viral copies (<250 copies / mL). A negative result must be combined with clinical observations, patient history, and epidemiological information.  Fact Sheet for Patients:    StrictlyIdeas.no  Fact Sheet for Healthcare Providers: BankingDealers.co.za  This test is not yet approved or  cleared by the [place] FDA and has been authorized for detection and/or diagnosis of SARS-CoV-2 by FDA under an Emergency Use Authorization (EUA).  This EUA will remain in effect (meaning this test can be used) for the duration of the COVID-19 declaration under Section 564(b)(1) of the Act, 21 U.S.C. section 360bbb-3(b)(1), unless the authorization is terminated or revoked sooner.  Performed at [facility], [place]  [address] [place], [place] [address]   Urinalysis, Microscopic (reflex)     Status: Abnormal   Collection Time:  12:04 PM  Result Value Ref Range   RBC / HPF NONE SEEN 0 - 5 RBC/hpf   WBC, UA 0-5 0 - 5 WBC/hpf   Bacteria, UA FEW (A) NONE SEEN   Squamous Epithelial / LPF NONE SEEN 0 - 5   Mucus PRESENT    Amorphous Crystal PRESENT     Comment: Performed at [facility], [place] [address] [place], [place] [address]  HIV Antibody (routine testing w rflx)     Status: None   Collection Time:   9:05 PM  Result Value Ref Range   HIV Screen 4th Generation wRfx Non Reactive Non Reactive    Comment: Performed at [facility], [place] [address], [place], [place] [address]  Hemoglobin A1c     Status: Abnormal   Collection Time:   9:05 PM  Result Value Ref Range   Hgb A1c MFr Bld 5.7 (H) 4.8 - 5.6 %    Comment: (NOTE) Pre diabetes:          5.7%-6.4%  Diabetes:              >6.4%  Glycemic control for   <7.0% adults with diabetes    Mean Plasma Glucose 116.89 mg/dL    Comment: Performed at [facility] [address] [place], [place] [address]  CBC with Differential/Platelet     Status: Abnormal   Collection Time:   9:05 PM  Result Value Ref Range   WBC 16.5 (H) 4.0 - 10.5 K/uL   RBC 4.49 4.22 - 5.81 MIL/uL   Hemoglobin 12.5 (L) 13.0 - 17.0 g/dL   HCT  39.9 39 - 52 %   MCV 88.9 80.0 - 100.0 fL   MCH 27.8 26.0 - 34.0 pg   MCHC 31.3 30.0 - 36.0 g/dL   RDW 14.1 11.5 - 15.5 %   Platelets 700 (H) 150 - 400 K/uL   nRBC 0.0 0.0 - 0.2 %   Neutrophils Relative % 81 %   Neutro Abs 13.3 (H) 1.7 - 7.7 K/uL   Lymphocytes Relative 6 %   Lymphs Abs 1.0 0.7 - 4.0 K/uL   Monocytes Relative 12 %   Monocytes Absolute 2.0 (H) 0 - 1 K/uL   Eosinophils Relative 0 %   Eosinophils Absolute 0.1 0 - 0 K/uL   Basophils Relative 0 %   Basophils Absolute 0.1 0 - 0 K/uL   WBC Morphology INCREASED BANDS (>20% BANDS)    Immature Granulocytes 1 %   Abs Immature Granulocytes 0.08 (H) 0.00 - 0.07 K/uL    Comment: Performed at [facility], [address] [place], [place] [address]  Comprehensive metabolic panel     Status: Abnormal   Collection Time:   9:05 PM  Result Value Ref Range   Sodium 136 135 - 145 mmol/L   Potassium 4.8 3.5 - 5.1 mmol/L   Chloride 98 98 - 111 mmol/L   CO2 20 (L) 22 - 32 mmol/L   Glucose, Bld 111 (H) 70 - 99 mg/dL    Comment: Glucose reference range applies only to samples taken after fasting for at least 8 hours.   BUN 44 (H) 6 - 20 mg/dL   Creatinine, Ser 1.29 (H) 0.61 - 1.24 mg/dL   Calcium 8.5 (L) 8.9 - 10.3 mg/dL   Total Protein 5.8 (L) 6.5 - 8.1  g/dL   Albumin 2.5 (L) 3.5 - 5.0 g/dL   AST 39 15 - 41 U/L   ALT 27 0 - 44 U/L   Alkaline Phosphatase 174 (H) 38 - 126 U/L   Total Bilirubin 1.6 (H) 0.3 - 1.2 mg/dL   GFR calc non Af Amer >60 >60 mL/min   GFR calc Af Amer >60 >60 mL/min   Anion gap 18 (H) 5 - 15    Comment: Performed at [facility] [address], [place], [place] [address]  Lactic acid, plasma     Status: None   Collection Time: 08/02/20  5:39 AM  Result Value Ref Range   Lactic Acid, Venous 1.5 0.5 - 1.9 mmol/L    Comment: Performed at [facility] [address] [place], [place] [address]  Comprehensive metabolic panel     Status: Abnormal   Collection Time: 08/02/20  5:39 AM   Result Value Ref Range   Sodium 136 135 - 145 mmol/L   Potassium 4.7 3.5 - 5.1 mmol/L   Chloride 98 98 - 111 mmol/L   CO2 23 22 - 32 mmol/L   Glucose, Bld 118 (H) 70 - 99 mg/dL    Comment: Glucose reference range applies only to samples taken after fasting for at least 8 hours.   BUN 43 (H) 6 - 20 mg/dL   Creatinine, Ser 1.10 0.61 - 1.24 mg/dL   Calcium 8.3 (L) 8.9 - 10.3 mg/dL   Total Protein 5.4 (L) 6.5 - 8.1 g/dL   Albumin 2.3 (L) 3.5 - 5.0 g/dL   AST 37 15 - 41 U/L   ALT 26 0 - 44 U/L   Alkaline Phosphatase 167 (H) 38 - 126 U/L   Total Bilirubin 1.5 (H) 0.3 - 1.2 mg/dL   GFR calc non Af Amer >60 >60 mL/min   GFR calc Af Amer >60 >60 mL/min   Anion gap 15 5 - 15    Comment: Performed at [facility] [address] [place], [place] [address]  CBC with Differential/Platelet     Status: Abnormal   Collection Time: 08/02/20  5:39 AM  Result Value Ref Range   WBC 17.9 (H) 4.0 - 10.5 K/uL   RBC 4.24 4.22 - 5.81 MIL/uL   Hemoglobin 12.2 (L) 13.0 - 17.0 g/dL   HCT 37.8 (L) 39 - 52 %   MCV 89.2 80.0 - 100.0 fL   MCH 28.8 26.0 - 34.0 pg   MCHC 32.3 30.0 - 36.0 g/dL   RDW 14.3 11.5 - 15.5 %   Platelets 726 (H) 150 - 400 K/uL   nRBC 0.0 0.0 - 0.2 %   Neutrophils Relative % 82 %   Neutro Abs 14.9 (H) 1.7 - 7.7 K/uL   Lymphocytes Relative 6 %   Lymphs Abs 1.0 0.7 - 4.0 K/uL   Monocytes Relative 11 %   Monocytes Absolute 1.9 (H) 0 - 1 K/uL   Eosinophils Relative 0 %   Eosinophils Absolute 0.0 0 - 0 K/uL   Basophils Relative 0 %   Basophils Absolute 0.0 0 - 0 K/uL   WBC Morphology INCREASED BANDS (>20% BANDS)    Immature Granulocytes 1 %   Abs Immature Granulocytes 0.11 (H) 0.00 - 0.07 K/uL   Polychromasia PRESENT     Comment: Performed at [facility], [place] [address] [place], [place] [address]    DG ABD ACUTE 2+V W 1V CHEST  Result Date:  CLINICAL  DATA:  Abdominal bloating EXAM: ACUTE ABDOMEN SERIES (2 VIEW ABDOMEN AND 1 VIEW CHEST) COMPARISON:   Abdomen radiograph July 28, 2020; CT abdomen and pelvis July 29, 2020 FINDINGS: PA chest: Multiple nodular opacities consistent with parenchymal lung metastases noted. No edema or airspace opacity. Heart size and pulmonary vascularity are normal. No adenopathy appreciable. Supine and upright abdomen: There remains somewhat generalized bowel dilatation consistent with distal colonic obstruction. There may be a secondary degree of ileus. No free air. No abnormal calcifications. IMPRESSION: 1. Evidence of distal colonic obstruction. There is now a degree of small-bowel dilatation as well which may be due to bowel obstruction or a degree of secondary ileus. No free air. 2. Extensive pulmonary metastases with multiple nodular lesions throughout the lungs. No edema or airspace opacity. Note that recent CT examination showed finding strongly suspicious for colon carcinoma distally. Electronically Signed   By: Lowella Grip III M.D.   On:  13:15   DG Abd Portable 1 View  Result Date:  CLINICAL DATA:  NG tube EXAM: X-RAY ABDOMEN 1 VIEW COMPARISON:  Radiograph , CT 07/29/2020 FINDINGS: Transesophageal tube tip terminates in the vicinity of the GE junction with the side port in the lower thoracic esophagus. Redemonstration of the diffuse gaseous distension of both large and small bowel compatible with a distal colonic obstruction seen on the comparison CT. No subdiaphragmatic free air is seen on this upright radiograph. Atelectatic changes are suspected in the lung bases, poorly visualized given over penetrated exam. Osseous structures are free of acute abnormality. IMPRESSION: 1. Transesophageal tube tip terminates in the vicinity of the GE junction with the side port in the lower thoracic esophagus. Recommend advancing approximately 10-12 cm for optimal functioning. 2. Persistent diffuse gaseous distension of both large and small bowel compatible with a distal colonic obstruction  seen on the comparison CT. Electronically Signed   By: Lovena Le M.D.   On:  22:05   Review of Systems  Constitutional: Positive for appetite change and fatigue. Negative for unexpected weight change.  HENT: Negative.   Eyes: Negative.   Respiratory: Negative.   Cardiovascular: Negative.   Gastrointestinal: Positive for abdominal distention, abdominal pain, constipation and nausea. Negative for blood in stool, diarrhea and rectal pain.  Endocrine: Negative.   Genitourinary: Negative.   Musculoskeletal: Negative.   Allergic/Immunologic: Negative.   Neurological: Negative.   Hematological: Negative.   Psychiatric/Behavioral: Negative for agitation, behavioral problems, confusion, decreased concentration, dysphoric mood, hallucinations, self-injury and sleep disturbance. The patient is nervous/anxious. The patient is not hyperactive.    Blood pressure (!) 149/97, pulse (!) 107, temperature 99.2 F (37.3 C), temperature source Oral, resp. rate (!) 21, SpO2 97 %. Physical Exam Constitutional:      General: He is in acute distress.     Appearance: He is ill-appearing and toxic-appearing. He is not diaphoretic.  HENT:     Head: Normocephalic and atraumatic.     Nose: Nose normal.     Mouth/Throat:     Mouth: Mucous membranes are dry.  Eyes:     Extraocular Movements: Extraocular movements intact.     Conjunctiva/sclera: Conjunctivae normal.     Pupils: Pupils are equal, round, and reactive to light.  Cardiovascular:     Rate and Rhythm: Normal rate and regular rhythm.     Pulses: Normal pulses.     Heart sounds: Normal heart sounds.  Pulmonary:     Effort: Pulmonary effort is normal.     Breath sounds: Normal breath  sounds.  Abdominal:     General: There is distension.     Tenderness: There is abdominal tenderness. There is guarding. There is no rebound.  Musculoskeletal:        General: Normal range of motion.     Cervical back: Normal range of motion and neck  supple.  Skin:    General: Skin is warm and dry.  Neurological:     General: No focal deficit present.     Mental Status: He is alert and oriented to person, place, and time.  Psychiatric:        Mood and Affect: Mood normal.        Behavior: Behavior normal.        Thought Content: Thought content normal.        Judgment: Judgment normal.   Assessment/Plan: 1) Metastatic colon cancer with a near obstructing apple core lesion in the rectosigmoid colon seen on CT scan complicated by hepatic and pulmonary metastatic disease complicated by enlarged retroperitoneal lymph nodes, colonic and small bowel ileus-I had extensive discussion with the patient and his wife about stent placement through the rectosigmoid mass. The stents usually migrate and are not a long-term answer to a situation like this and therefore I think he might benefit from a diverting colostomy. I think he will be best served by an oncology and a surgical evaluation. As per my discussion with Dr. Dorian Pod the family intends to transfer the patient to [facility] for more expeditious treatment.  We will continue to follow till he gets transferred there and make for further recommendations as needed. 2) Acute kidney injury. 3) Elevated alkaline phosphatase-liver mets. 4) History of nephrolithiasis with renal stones noted on the recent CT bilaterally. 5) Gallbladder sludge noted on recent CT  Juanita Craver 08/02/2020, 9:34 AM

## 2020-08-02 NOTE — Progress Notes (Signed)
   Subjective: Patient admitted to hospital with GI  Consult placed overnight. Patient had NG tube placed due to colonic obstruction causing nausea, vomiting and no BM in at least 7 days. Patient and wife politely requested to be transferred to [facility] as they feel the matter is urgent and feel that the matter could be handled faster at another hospital.  Objective:  Vital signs in last 24 hours: Vitals:   08/02/20 1200 08/02/20 1300 08/02/20 1330 08/02/20 1408  BP: (!) 163/125  (!) 150/104 (!) 143/97  Pulse: (!) 117 (!) 122 (!) 112 (!) 120  Resp: (!) 21 20 (!) 23 20  Temp:    99 F (37.2 C)  TempSrc:    Oral  SpO2: 96% 97% 98% 96%   Physical Exam Constitutional:      Comments: Thin appearing  HENT:     Head: Normocephalic and atraumatic.  Cardiovascular:     Rate and Rhythm: Regular rhythm. Tachycardia present.  Pulmonary:     Breath sounds: Normal breath sounds.     Comments: Slight tachypnea but able to speak in full sentences, but makes remarks about how speaking makes him tired and can irritate his diaphragm.   Patient starts hiccupping towards the end of interview. Abdominal:     General: There is distension.     Comments: Hypoactive bowel sounds  Neurological:     Mental Status: He is alert.  Psychiatric:        Mood and Affect: Mood normal.        Behavior: Behavior normal.     Assessment/Plan:  Active Problems:   Colonic obstruction (HCC) Colonic obstruction Likely metastatic colon carcinoma  Patient was having signifcant nasuea and vomiting and unable to have PO intake. NG tube was placed and provided relief and evidence of draining was noted during interview. Patient kept NPO. Patient was also provided Zofran and  Toradol PRN for pain. Consulted GI who recommended a diverting colostomy. GI did not feel that a stent would hold well in the patient.  - Transfer to surgical oncology at [facility] at patient's wishes; patient has been accepted by their team  Acute Kidney  Injury lilely 2/2 Dehydration Patient received 2L LR bolus and had LR mIVF.  Trended BUN and Cr. Noted decrease in both values after fluids provided.  - Continue mIVF until transfer - NPO  #Hypertension Held patient home metoprolol. BP range 142/98-157/103 likely 2/2 to pain and dehydration with history of HTN.  - Toradol PRN   Prior to Admission Living Arrangement: Home Dispo: Transfer to [facility], MD 08/02/2020, 2:28 PM Pager:[phone] After 5pm on weekdays and 1pm on weekends: On Call pager [phone]

## 2020-08-02 NOTE — ED Notes (Signed)
No drainage noted in suction canister from pt's NG tube.  NG tube was noted to not be clamp.  Tube reconnected to suction and drainage noted.

## 2020-08-02 NOTE — ED Notes (Signed)
Requested pain meds again.

## 2020-08-02 NOTE — Discharge Summary (Signed)
 Name: Seth Hughes MRN: [id number] DOB: 1961/06/06 59 y.o. PCP: Myrlene Broker, MD  Date of Admission:   7:28 AM Date of Discharge:  Attending Physician: Angelica Pou, MD  Discharge Diagnosis: 1.Distal Colonic obstruction  Discharge Medications: Allergies as of 08/02/2020   No Known Allergies     Medication List    STOP taking these medications   docusate sodium 100 MG capsule Commonly known as: COLACE   multivitamin with minerals Tabs tablet   ondansetron 4 MG disintegrating tablet Commonly known as: ZOFRAN-ODT   oxyCODONE 5 MG immediate release tablet Commonly known as: Oxy IR/ROXICODONE   promethazine 12.5 MG tablet Commonly known as: PHENERGAN   sulfamethoxazole-trimethoprim 800-160 MG tablet Commonly known as: BACTRIM DS     TAKE these medications   citalopram 40 MG tablet Commonly known as: CELEXA Take 40 mg by mouth daily with supper.   diclofenac 50 MG EC tablet Commonly known as: VOLTAREN Take 50 mg by mouth 2 (two) times daily.   LORazepam 0.5 MG tablet Commonly known as: ATIVAN Take 0.25 mg by mouth every 8 (eight) hours as needed for anxiety (stress).   metoprolol succinate 50 MG 24 hr tablet Commonly known as: TOPROL-XL Take 50 mg by mouth daily with supper.   tamsulosin 0.4 MG Caps capsule Commonly known as: FLOMAX Take 0.4 mg by mouth daily after supper.       Disposition and follow-up:   Seth Hughes was discharged from [facility] in Stable condition.  At the hospital follow up visit please address:  1.  Please determine hospital follow-up plans with Surgical Oncology at [facility].  2.  Labs / imaging needed at time of follow-up: Lactate dehydrogenase, Bcx x2, Ucx  3.  Pending labs/ test needing follow-up: Recommendations per surgical oncology at [place]: Per surgical oncology at [facility].  Hospital Course by problem list: Seth Hughes is 59yo male with hypertension, history  of nephrolithiasis admitted for colonic obstruction 2/2 newly-found mass concerning for metastatic colon cancer. Patient was admitted .    #Colonic obstruction Likely metastatic colon carcinoma XR Abdominal series noted evidence of distal colonic obstruction with a degree of small-bowel-dilatation likely 2/2 bowel obstruction or a degree of secondary ileus. No free air was noted. Extensive mets to the Lungs with multiple nodular lesions noted.  Admitting team noted that a CT 9/8 was significant for apple-core type lesion in rectosigmoid, multiple hypodense lesions in liver consistent with metastatic disease, and innumerable pulmonary nodules consistent with metastatic disease. Patient was having signifcant nasuea and vomiting and unable to have PO intake. NG tube was placed and provided relief. Patient kept NPO. Patient was also provided Zofran and  Toradol PRN for pain. Consulted GI who recommended diverting colostomy. GI did not feel that a stent would hold well in the patient.   Acute Kidney Injury lilely 2/2 Dehydration Patient received 2L LR bolus and had LR mIVF.  Trended BUN and Cr. Noted decrease in both values after fluids provided.   #Hypertension Held patient home metoprolol. BP range 142/98-157/103 likely 2/2 to pain and dehydration with history of HTN.   Discharge Vitals:   BP (!) 143/97 (BP Location: Right Arm)   Pulse (!) 120   Temp 99 F (37.2 C) (Oral)   Resp 20   SpO2 96%   Pertinent Labs, Studies, and Procedures:   XR Abdominal Series  XR Abdomen  Discharge Instructions: Discharge Instructions    Increase activity slowly   Complete by:  As directed     Please continue discussion with Surgical Oncology concerning colon disease and likely metastasis to the lungs and liver.   Signed: Freida Busman, MD 08/02/2020, 3:59 PM   Pager: [phone]

## 2020-08-02 NOTE — Progress Notes (Signed)
Pt IV's saline locked. NG tube capped. Pt d/c education provided at bedside. Pt has all belongings. Pt transported to [facility] via [facility].

## 2020-08-02 NOTE — ED Notes (Signed)
Patient is resting comfortably. 

## 2020-08-05 MED ORDER — ACETAMINOPHEN 500 MG TABLET
ORAL_TABLET | Freq: Four times a day (QID) | ORAL | 0 refills | 4 days
Start: 2020-08-05 — End: ?

## 2020-08-06 LAB — CULTURE, BLOOD (ROUTINE X 2): Culture: NO GROWTH

## 2020-08-07 LAB — CULTURE, BLOOD (ROUTINE X 2): Culture: NO GROWTH

## 2020-08-21 ENCOUNTER — Encounter
Admit: 2020-08-21 | Discharge: 2020-08-21 | Payer: PRIVATE HEALTH INSURANCE | Attending: Internal Medicine | Primary: Internal Medicine

## 2020-08-21 ENCOUNTER — Encounter: Admit: 2020-08-21 | Discharge: 2020-08-21 | Payer: PRIVATE HEALTH INSURANCE

## 2020-08-21 DIAGNOSIS — C787 Secondary malignant neoplasm of liver and intrahepatic bile duct: Secondary | ICD-10-CM

## 2020-08-21 DIAGNOSIS — C189 Malignant neoplasm of colon, unspecified: Secondary | ICD-10-CM

## 2020-08-21 DIAGNOSIS — K6389 Other specified diseases of intestine: Principal | ICD-10-CM

## 2020-08-21 MED ORDER — OXYCODONE 5 MG TABLET
ORAL_TABLET | Freq: Four times a day (QID) | ORAL | 0 refills | 8 days | Status: CP | PRN
Start: 2020-08-21 — End: ?

## 2020-08-21 MED ORDER — ONDANSETRON HCL 8 MG TABLET
ORAL_TABLET | Freq: Two times a day (BID) | ORAL | 3 refills | 30.00000 days | Status: CP | PRN
Start: 2020-08-21 — End: 2020-09-20

## 2020-08-21 DEATH — deceased

## 2020-08-24 DIAGNOSIS — C787 Secondary malignant neoplasm of liver and intrahepatic bile duct: Principal | ICD-10-CM

## 2020-08-24 DIAGNOSIS — C189 Malignant neoplasm of colon, unspecified: Principal | ICD-10-CM

## 2020-08-27 DIAGNOSIS — C189 Malignant neoplasm of colon, unspecified: Principal | ICD-10-CM

## 2020-08-27 DIAGNOSIS — C787 Secondary malignant neoplasm of liver and intrahepatic bile duct: Principal | ICD-10-CM

## 2020-08-27 MED ORDER — HEPARIN, PORCINE (PF) 100 UNIT/ML INTRAVENOUS SYRINGE
INTRAVENOUS | PRN refills | 0.00000 days
Start: 2020-08-27 — End: ?

## 2020-08-27 MED ORDER — SODIUM CHLORIDE 0.9 % (FLUSH) INJECTION SYRINGE
INTRAVENOUS | PRN refills | 0.00000 days
Start: 2020-08-27 — End: ?

## 2020-09-03 MED ORDER — LOPERAMIDE 2 MG CAPSULE
ORAL_CAPSULE | 11 refills | 0 days | Status: CP
Start: 2020-09-03 — End: ?

## 2020-09-04 ENCOUNTER — Other Ambulatory Visit: Admit: 2020-09-04 | Discharge: 2020-09-04 | Payer: PRIVATE HEALTH INSURANCE

## 2020-09-04 ENCOUNTER — Ambulatory Visit: Admit: 2020-09-04 | Discharge: 2020-09-04 | Payer: PRIVATE HEALTH INSURANCE

## 2020-09-04 ENCOUNTER — Encounter: Admit: 2020-09-04 | Discharge: 2020-09-04 | Payer: PRIVATE HEALTH INSURANCE

## 2020-09-04 DIAGNOSIS — C787 Secondary malignant neoplasm of liver and intrahepatic bile duct: Principal | ICD-10-CM

## 2020-09-04 DIAGNOSIS — C189 Malignant neoplasm of colon, unspecified: Principal | ICD-10-CM

## 2020-09-18 ENCOUNTER — Encounter: Admit: 2020-09-18 | Discharge: 2020-10-01 | Payer: PRIVATE HEALTH INSURANCE

## 2020-09-18 ENCOUNTER — Other Ambulatory Visit: Admit: 2020-09-18 | Discharge: 2020-09-18 | Payer: PRIVATE HEALTH INSURANCE

## 2020-09-18 ENCOUNTER — Ambulatory Visit
Admit: 2020-09-18 | Discharge: 2020-09-19 | Payer: PRIVATE HEALTH INSURANCE | Attending: Internal Medicine | Primary: Internal Medicine

## 2020-09-18 ENCOUNTER — Encounter
Admit: 2020-09-18 | Discharge: 2020-10-01 | Payer: PRIVATE HEALTH INSURANCE | Attending: Registered" | Primary: Registered"

## 2020-09-18 ENCOUNTER — Encounter: Admit: 2020-09-18 | Discharge: 2020-09-19 | Payer: PRIVATE HEALTH INSURANCE

## 2020-09-18 DIAGNOSIS — C189 Malignant neoplasm of colon, unspecified: Principal | ICD-10-CM

## 2020-09-18 DIAGNOSIS — C787 Secondary malignant neoplasm of liver and intrahepatic bile duct: Principal | ICD-10-CM

## 2020-09-18 DIAGNOSIS — D701 Agranulocytosis secondary to cancer chemotherapy: Principal | ICD-10-CM

## 2020-09-18 DIAGNOSIS — T451X5A Adverse effect of antineoplastic and immunosuppressive drugs, initial encounter: Principal | ICD-10-CM

## 2020-09-18 MED ORDER — PEGFILGRASTIM-BMEZ 6 MG/0.6 ML SUBCUTANEOUS SYRINGE
3 refills | 0 days | Status: CP
Start: 2020-09-18 — End: ?
  Filled 2020-09-30: qty 1.2, 28d supply, fill #0

## 2020-09-18 MED ORDER — HYDROCODONE 5 MG-ACETAMINOPHEN 325 MG TABLET
ORAL_TABLET | Freq: Four times a day (QID) | ORAL | 0 refills | 8 days | Status: CP | PRN
Start: 2020-09-18 — End: ?

## 2020-09-18 MED ORDER — DEXAMETHASONE 4 MG TABLET
ORAL_TABLET | 5 refills | 0 days | Status: CP
Start: 2020-09-18 — End: ?
  Filled 2020-09-18: qty 24, 56d supply, fill #0

## 2020-09-18 MED FILL — DEXAMETHASONE 4 MG TABLET: 56 days supply | Qty: 24 | Fill #0 | Status: AC

## 2020-09-21 ENCOUNTER — Encounter: Admit: 2020-09-21 | Discharge: 2020-09-22 | Payer: PRIVATE HEALTH INSURANCE

## 2020-09-21 DIAGNOSIS — C189 Malignant neoplasm of colon, unspecified: Principal | ICD-10-CM

## 2020-09-21 DIAGNOSIS — C787 Secondary malignant neoplasm of liver and intrahepatic bile duct: Principal | ICD-10-CM

## 2020-09-23 NOTE — Unmapped (Signed)
Ziextenzo removed from [facility] treatment plan for clinic administration given recent approval for self-administration at home via pharmacy benefits for future cycles. [facility] has completed education and arranged delivery date for patient to receive in advance of needing for next treatment cycle.    Care coordination: 5 minutes

## 2020-09-23 NOTE — Unmapped (Signed)
[facility] Specialty Medication Onboarding    Specialty Medication: Ziextenzo  Prior Authorization: Approved   Financial Assistance: No - copay  <$25  Final Copay/Day Supply: $0 / 28 days    Insurance Restrictions: Yes - max 1 month supply     Notes to Pharmacist:     The triage team has completed the benefits investigation and has determined that the patient is able to fill this medication at [facility]. Please contact the patient to complete the onboarding or follow up with the prescribing physician as needed.

## 2020-09-23 NOTE — Unmapped (Signed)
[facility]   Patient Onboarding/Medication Counseling    [name] is a 59 y.o. male with Metastatic colon cancer to liver who I am counseling today on initiation of therapy.  I am speaking to the patient.    Was a Nurse, learning disability used for this call? No    Verified patient's date of birth / HIPAA.    Specialty medication(s) to be sent: Hematology/Oncology: Ziextenzo 6 mg/0.6 mL injection    Non-specialty medications/supplies to be sent: None    Medications not needed at this time: none     Ziextenzo (pegfilgrastim)    Medication & Administration     Dosage: Inject the contents of one syringe (0.6 mL or 6 mg total) under the skin once per each chemotherapy cycle. Administer 24 hours after completion of chemotherapy.    Administration: Inject under the skin of the thigh, abdomen, buttocks or upper arm. Rotate sites with each injection.  ??? Injection instructions   o Take 1 syringe out of the refrigerator and allow to stand at room temperature for at least 15-30 minutes  o Wash hands and remove syringe from the tray  o Check the syringe for the following   - Expiration date  - Medication is clear and colorless to slightly yellow and free from particles   - It appears unused or damaged and the gray needle cap is securely attached and the clear needle guard has not been activated (if the needle guard is covering the needle that means it has been activated)  o Choose your injection site (abdomen but not within 2 inches of the navel, thigh, or if someone else is injecting you may also use upper arms or upper outer area of buttock).  Choose a different site each time you give yourself an injection and do not inject into areas where the skin is tender, bruised, red, scaly or hard.  Avoid areas with scars or stretch marks  o Clean the injection site with an alcohol wipe using a circular motion and allow it to air dry completely (5-10 seconds)  o Hold the prefilled syringe by the syringe barrel.  Carefully pull the needle cap straight off and discard  o With your other hand, gently pinch the skin at the injection site to create a firm surface.  Insert the needle in to skin at a 45-90 degree angle. Push the needle all the way in to ensure that the medicine can be fully injected.  o Slowly press down the plunger head as far as it will go until the plunger head is completely between the needle guard wings (keep skin pinched during this)  o Keep the plunger fully pressed down while you carefully pull the needle straight out from the injection site and off your skin  o Slowly release the plunger and allow the syringe needle guard to automatically cover the exposed needle  o Dispose of the used prefilled syringe into a sharps container or hard plastic bottle.  o If there is blood at the injection site gently press a cotton ball or gauze to the site. Do not rub the injection site. Apply an adhesive bandage if needed    Adherence/Missed dose instruction: If a dose is missed, call your doctor.    Goals of Therapy     Stimulate the growth of neutrophils (a type of white blood important to fight against infection) used after chemotherapy.    Side Effects & Monitoring Parameters   ??? Injection site irritation  ???  Pain/aching in the bones, arms and legs    The following side effects should be reported to the provider:  ??? Signs of an allergic reaction (rash, hives, shortness of breath, tongue or throat swelling)  ??? Kidney problems (unable to pass urine, blood in urine, change in amount of urine passed, change in urine color, or weight gain)  ??? Lung problems (trouble breathing, new or worsening of cough)  ??? Capillary leak syndrome (abnormal heartbeat, chest pain, shortness of breath, weight gain, vomiting blood or vomit that looks like coffee grounds, black or tarry stools)  ??? Spleen rupture (pain in left upper stomach area or left shoulder)  ??? Inflammation of the aorta (fever, abdominal pain, fatigue, back pain)    Contraindications, Warnings, & Precautions     ??? Hypersensitivity to pegfilgrastim, filgrastim, or any components of the formulation  ??? Allergy to latex  ??? Pegfilgrastim does cross the placenta therefore you should not become pregnant during treatment  ??? It is not known if pegfilgrastim passes into breastmilk therefore not recommended    Drug/Food Interactions     ??? Medication list reviewed in Epic. The patient was instructed to inform the care team before taking any new medications or supplements. No drug interactions identified.     Storage, Handling Precautions, & Disposal     ??? Ziextenzo should be stored in the refrigerator.   ??? Avoid freezing syringe but if frozen may be thawed one time. If frozen more than 1 time use a new syringe  ??? Discard if kept at room temperature for >120 hours  ??? Do not open the outer carton until you are ready to use the syringe in order to protect it from light  ??? Do not use if the syringe has been dropped on a hard surface.  It may be broken even if you cannot see the break  ??? Do not shake the prefilled syringe  ??? Keep out of the reach of children  ??? Place used devices into a sharps container for disposal (which we can supply along with band-aids and alcohol pads) or hard plastic container     Current Medications (including OTC/herbals), Comorbidities and Allergies     Current Outpatient Medications   Medication Sig Dispense Refill   ??? acetaminophen (TYLENOL) 500 MG tablet Take 1,000 mg by mouth every eight (8) hours as needed for pain.     ??? citalopram (CELEXA) 40 MG tablet Take 40 mg by mouth daily.     ??? dexAMETHasone (DECADRON) 4 MG tablet Take 2 tablets (8 mg total) by mouth on Days 2, 3, and 4 of each chemotherapy cycle. 24 tablet 5   ??? docusate sodium (COLACE) 100 MG capsule Take 100 mg by mouth two (2) times a day as needed for constipation. (Patient not taking: Reported on 09/18/2020)     ??? fluticasone propionate (FLONASE) 50 mcg/actuation nasal spray 1 spray into each nostril daily as needed for rhinitis.     ??? heparin, porcine, PF, 100 unit/mL Syrg Infuse 5 mL into a venous catheter every fourteen (14) days. For use while patient is on fluorouracil (5-FU) home infusion.   Flush IV catheter with heparin 5 mL as directed after final saline flush per SASH method and as needed for line maintenance. 4 each PRN   ??? HYDROcodone-acetaminophen (NORCO) 5-325 mg per tablet Take 1 tablet by mouth every six (6) hours as needed for pain. (Patient not taking: Reported on 09/18/2020) 30 tablet 0   ??? loperamide (IMODIUM)  2 mg capsule Take 2 capsules to start, then 1 capsule every 2 hours until diarrhea free for 12 hours. 60 capsule 11   ??? LORazepam (ATIVAN) 0.5 MG tablet Take by mouth. 1/2-1 (0.25mg -0.5mg ) EVERY 8 HOURS AS NEEDED     ??? metoprolol succinate (TOPROL-XL) 50 MG 24 hr tablet Take 50 mg by mouth daily.     ??? multivitamin (TAB-A-VITE/THERAGRAN) per tablet Take 1 tablet by mouth daily. Centrum Adult     ??? oxyCODONE (ROXICODONE) 5 MG immediate release tablet Take 1 tablet (5 mg total) by mouth every six (6) hours as needed for pain. (Patient not taking: Reported on 09/18/2020) 30 tablet 0   ??? pegfilgrastim-bmez (ZIEXTENZO) 6 mg/0.6 mL injection Inject the contents of one syringe (0.6 mL or 6 mg total) under the skin once per each chemotherapy cycle. Administer 24 hours after completion of chemotherapy. (Patient not taking: Reported on 09/18/2020) 1.2 mL 3   ??? promethazine (PHENERGAN) 12.5 MG tablet Take 12.5 mg by mouth every eight (8) hours as needed for nausea.     ??? propylene glycoL (SYSTANE BALANCE) 0.6 % Drop Apply 1 drop to eye nightly as needed.     ??? sodium chloride (NS) 0.9 % injection Infuse 10 mL into a venous catheter every fourteen (14) days. For use while patient is on fluorouracil (5-FU) home infusion.   Flush IV catheter with normal saline 10 mL prior to and after infusion followed by heparin flush per SASH method and as needed for line maintenance. 6 each PRN   ??? tamsulosin (FLOMAX) 0.4 mg capsule Take 0.4 mg by mouth daily.       No current facility-administered medications for this visit.       No Known Allergies    Patient Active Problem List   Diagnosis   ??? Metastatic colon cancer to liver (CMS-HCC)     Reviewed and up to date in Epic.    Appropriateness of Therapy     Is medication and dose appropriate based on diagnosis? Yes    Prescription has been clinically reviewed: Yes    Baseline Quality of Life Assessment      How many days over the past month did your metastatic colon cancer to liver; chemotherapy induced neutropenia  keep you from your normal activities? For example, brushing your teeth or getting up in the morning. 0    Financial Information     Medication Assistance provided: Prior Authorization    Anticipated copay of $0 / 28 days reviewed with patient. Verified delivery address.    Delivery Information     Scheduled delivery date: 10/01/20    Expected start date: 10/05/20    Medication will be delivered via UPS to the prescription address in [facility].  This shipment will not require a signature.      Explained the services we provide at [facility] and that each month we would call to set up refills.  Stressed importance of returning phone calls so that we could ensure they receive their medications in time each month.  Informed patient that we should be setting up refills 7-10 days prior to when they will run out of medication.  A pharmacist will reach out to perform a clinical assessment periodically.  Informed patient that a welcome packet and a drug information handout will be sent.      Patient verbalized understanding of the above information as well as how to contact the pharmacy at [phone] option 4 with  any questions/concerns.  The pharmacy is open Monday through Friday 8:30am-4:30pm.  A pharmacist is available 24/7 via pager to answer any clinical questions they may have.    Patient Specific Needs     - Does the patient have any physical, cognitive, or cultural barriers? No    - Patient prefers to have medications discussed with  Patient     - Is the patient or caregiver able to read and understand education materials at a high school level or above? Yes    - Patient's primary language is  English     - Is the patient high risk? No    - Does the patient require a Care Management Plan? No     - Does the patient require physician intervention or other additional services (i.e. nutrition, smoking cessation, social work)? No      Deanne Bedgood A [name] Specialty Pharmacist

## 2020-09-28 NOTE — Unmapped (Signed)
Service:  Hematology and Oncology  Patient Name: Manuel White  Patient Age: 59 y.o.  Encounter Date: 09/18/2020    PRIMARY CARE PHYSICIAN  Hadley Pen, MD  [address] [facility]  [place] [address]    [facility]  [address] [place], [place]  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 Large bowel obstruction (rectosigmoid obstructing mass), s/p loop diverting ostomy 08-03-20  3. Weight loss  4. A fib with one episode SVT inpatient    Discussed initiation of FOLFOXIRI + avastin including survival benefit and toxicities. He is in agreement to proceed. TEMPUS pending. Will discuss bone scan results with Dr. Josefa Half to assess likelihood of metastatic foci.      - he will need colonoscopy at some point in future, as he has not had one to date and we cannot rule out synchronous lesions  - abdominal pain: norco refilled  - nausea: prescribed zofran in addition to home phenergan  - weight loss: nutrition referral  - COVID -19 risk:   Pending second Moderna vaccine dose. Discussed that timing of third Moderna dose can be as soon as within 28 days of the second.      _____________________________________________________________________   ONCOLOGY HISTORY  Diagnosed age 2 (Sept 2021) with metastatic colorectal cancer.  PMH is notable for kidney stones and A fib. Reports intermittent diarrhea and abdominal pain since March 2021. CT 07-29-20 with colon mass.  He presented to [facility] the weekend prior to planned palliative colonic stent for large bowel obstruction, and underwent flex sig with diverting loop colonoscopy 08-03-20.  Biopsy consistent with moderately differentiated adenocarcinoma (MSS). Staging CT (OSH) notable for liver, lung and possible L iliac met.    INTERVAL HISTORY:  Accompanied by his wife. Feels well since last chemotherapy except for fatigue and mild abdominal cramping/diarrhea (increased ostomy output).  ??  A 10 point ROS was performed and negative except as noted above in the HPI.    _____________________________________________  OTHER PAST MEDICAL HISTORY / CURRENT PROBLEM LIST    Afib/SVT  HTN  MDD/Anxiety  BPH    Past Medical History:   Diagnosis Date   ??? BPH (benign prostatic hyperplasia)    ??? Colon cancer (CMS-HCC)    ??? Hypertension       _____________________________________________________________________  ALLERGIES    Patient has no known allergies.     _____________________________________________________________________  CURRENT MEDICATIONS    I am having [name] start on HYDROcodone-acetaminophen. I am also having him maintain his citalopram, docusate sodium, LORazepam, metoprolol succinate, promethazine, tamsulosin, fluticasone propionate, oxyCODONE, acetaminophen, multivitamin, loperamide, (heparin, porcine (PF)), sodium chloride, pegfilgrastim-bmez, and dexAMETHasone.     _____________________________________________________________________  HABITS    SOCIAL HISTORY  Social History     Socioeconomic History   ??? Marital status: Married     Spouse name: Not on file   ??? Number of children: Not on file   ???  Years of education: Not on file   ??? Highest education level: Not on file   Occupational History   ??? Not on file   Tobacco Use   ??? Smoking status: Never Smoker   ??? Smokeless tobacco: Never Used   Substance and Sexual Activity   ??? Alcohol use: Not on file   ??? Drug use: Not on file   ??? Sexual activity: Not on file   Other Topics Concern   ??? Not on file   Social History Narrative   ??? Not on file     Social Determinants of Health     Financial Resource Strain: Low Risk    ??? Difficulty of Paying Living Expenses: Not hard at all   Food Insecurity: No Food Insecurity   ??? Worried About Programme researcher, broadcasting/film/video in the Last Year: Never true   ??? Ran Out of Food in the Last Year: Never true   Transportation Needs: No Transportation Needs   ??? Lack of Transportation (Medical): No   ??? Lack of Transportation (Non-Medical): No   Physical Activity: Not on file   Stress: Not on file   Social Connections: Not on file         FAMILY HISTORY  Mother- Uterine, Breast Ca, deceased     _____________________________________________________________________  PHYSICAL EXAM    VITAL SIGNS: BP 97/60  - Pulse 91  - Resp 18  - Ht 180.3 cm (5' 11)  - Wt 47.1 kg (103 lb 12.8 oz)  - SpO2 100%  - BMI 14.48 kg/m??   Gen: Thin man, sitting comfortably on couch, NAD  HEENT: PERRL, non-icteric sclera, no cervical or supraclavicular LAD  CV: RRR, no M/R/G, no LE edema  Pulm: CTAB  AB: +BS, soft, ND, ND, ostomy in place with normal output  Skin: no rash, lesions  Psych: normal affect,    _____________________________________________________________________  LABS    Lab on 09/18/2020   Component Date Value   ??? Sodium 09/18/2020 138    ??? Potassium 09/18/2020 3.8    ??? Chloride 09/18/2020 103    ??? Anion Gap 09/18/2020 4*   ??? CO2 09/18/2020 31.0    ??? BUN 09/18/2020 12    ??? Creatinine 09/18/2020 0.72    ??? BUN/Creatinine Ratio 09/18/2020 17    ??? EGFR CKD-EPI Non-African* 09/18/2020 >90    ??? EGFR CKD-EPI African Ame* 09/18/2020 >90    ??? Glucose 09/18/2020 89    ??? Calcium 09/18/2020 8.6*   ??? Albumin 09/18/2020 2.5*   ??? Total Protein 09/18/2020 5.9    ??? Total Bilirubin 09/18/2020 0.2*   ??? AST 09/18/2020 23    ??? ALT 09/18/2020 13    ??? Alkaline Phosphatase 09/18/2020 253*   ??? Magnesium 09/18/2020 1.6 ??? WBC 09/18/2020 5.9    ??? RBC 09/18/2020 3.32*   ??? HGB 09/18/2020 9.4*   ??? HCT 09/18/2020 29.0*   ??? MCV 09/18/2020 87.4    ??? MCH 09/18/2020 28.4    ??? MCHC 09/18/2020 32.5    ??? RDW 09/18/2020 16.5*   ??? MPV 09/18/2020 7.7    ??? Platelet 09/18/2020 454*   ??? Neutrophils % 09/18/2020 70.9    ??? Lymphocytes % 09/18/2020 14.3    ??? Monocytes % 09/18/2020 9.2    ??? Eosinophils % 09/18/2020 2.8    ??? Basophils % 09/18/2020 0.6    ??? Absolute Neutrophils 09/18/2020 4.2    ??? Absolute Lymphocytes 09/18/2020 0.8*   ??? Absolute Monocytes 09/18/2020 0.6    ???  Absolute Eosinophils 09/18/2020 0.2    ??? Absolute Basophils 09/18/2020 0.0    ??? Large Unstained Cells 09/18/2020 2    ??? Anisocytosis 09/18/2020 Slight*   ??? Hypochromasia 09/18/2020 Moderate*   ??? Spec Gravity/POC 09/18/2020 >=1.030    ??? PH/POC 09/18/2020 5.5    ??? Leuk Esterase/POC 09/18/2020 Negative    ??? Nitrite/POC 09/18/2020 Negative    ??? Protein/POC 09/18/2020 Trace*   ??? UA Glucose/POC 09/18/2020 Negative    ??? Ketones, POC 09/18/2020 Negative    ??? Bilirubin/POC 09/18/2020 Negative    ??? Blood/POC 09/18/2020 Negative    ??? Urobilinogen/POC 09/18/2020 0.2        RADIOLOGY / OTHER DIAGNOSTIC STUDIES  Flexible sigmoidoscopy 08-03-20:  Findings:       Hemorrhoids were found on perianal exam.       A fungating and completely obstructing large mass was found at 16 cm        proximal to the anus. No bleeding was present. Biopsies were taken with        a cold forceps for histology.                                                                                   Impression:            - Hemorrhoids found on perianal exam.                         - Likely malignant completely obstructing tumor at 16                          cm proximal to the anus. Biopsied.    PATHOLOGY  08-03-20:  Addendum   The purpose of this addendum is to report the results of immunohistochemical stains for mismatch repair proteins.    ??  Immunohistochemical stains for MLH1, MSH2, MSH6 and PMS2 were performed on block A1.  The tumor cells demonstrate appropriate nuclear expression for all four markers.  This is the normal phenotype and does NOT support a diagnosis of microsatellite instability/hereditary non-polyposis colorectal cancer (HNPCC).  Separate molecular testing for microsatellite instability markers will be performed by the [employer] (phone # [phone]) and reported separately.    ??  This result may have implications for therapy selection, as MSS/mismatch repair proficient tumors are less likely to respond to immune modulating agents like Pembrolizumab (1).  ??  1) Le DT, et al.?? [employer] J Med. 2015 Jun 25;372(26):2509-20. PMID: 26948546  ??  The original diagnoses are unchanged.  ??  Selected tissue block for possible future molecular pathology studies:  A1  ??  ??   Addendum electronically signed by Lyla Glassing, MD on 08/05/2020 at 1621   Final Diagnosis   A: Colon, rectosigmoid tumor, biopsy  - Invasive moderately differentiated adenocarcinoma associated with scant fragments of a tubulovillous adenoma with high-grade dysplasia  - No lymphovascular space invasion identified  - See comment         Imaging:  CT CAP 08-03-20 (OSH scan, reviewed here):  Large bowel obstruction due to a 4.8 cm mass  at the rectosigmoid junction, likely primary colonic malignancy.  ??  Multifocal hepatic masses and enlarged, heterogeneous left para-aortic lymph nodes, likely metastatic.   ??  Possible osseous metastasis in the left medial iliac bone. Bone scan or MSK protocol pelvic MRI could be considered to more accurately characterize.  ??  Innumerable pulmonary nodules, better visualized on concurrent chest CT.    Bone scan 09-18-20:  Slight increased uptake in the medial aspect of the of the left iliac bone corresponding to the lucency surrounded by sclerotic change seen on CT dated 07/29/20.

## 2020-09-29 NOTE — Unmapped (Signed)
NN called to see how he has been feeling after chemo (FOLFOXIRI + Bevacizumab) on 10/29. Felt a little weak first couple of days. After his Ziextenzo injection, he had low appetite and energy but this only lasted a day. He did have some cold sensitivity when he drank cold fluids for a day but it resolved as well.  Now he is eating and drinking just fine. Energy feels better. Did have one episode of diarrhea this morning but none sense. He verbalized he would take loperamide if diarrhea worsened.   Has follow up appointment with Dr Tye Maryland on 11/12 and next cycle of FOLFOXIRI + Bevacizumab.     No other questions or concerns at this time.  Ezzard Standing, RN  Per diem nurse navigator covering for GI on 11/8

## 2020-09-30 MED FILL — ZIEXTENZO 6 MG/0.6 ML SUBCUTANEOUS SYRINGE: 28 days supply | Qty: 1 | Fill #0 | Status: AC

## 2020-10-01 ENCOUNTER — Encounter: Admit: 2020-10-01 | Discharge: 2020-10-02 | Payer: PRIVATE HEALTH INSURANCE

## 2020-10-01 DIAGNOSIS — R509 Fever, unspecified: Principal | ICD-10-CM

## 2020-10-01 DIAGNOSIS — C787 Secondary malignant neoplasm of liver and intrahepatic bile duct: Principal | ICD-10-CM

## 2020-10-01 DIAGNOSIS — C189 Malignant neoplasm of colon, unspecified: Principal | ICD-10-CM

## 2020-10-01 LAB — CBC W/ AUTO DIFF
BASOPHILS ABSOLUTE COUNT: 0.2 10*9/L — ABNORMAL HIGH (ref 0.0–0.1)
BASOPHILS RELATIVE PERCENT: 0.8 %
EOSINOPHILS ABSOLUTE COUNT: 0 10*9/L (ref 0.0–0.4)
EOSINOPHILS RELATIVE PERCENT: 0.1 %
HEMATOCRIT: 32.4 % — ABNORMAL LOW (ref 41.0–53.0)
HEMOGLOBIN: 10.3 g/dL — ABNORMAL LOW (ref 13.5–17.5)
LARGE UNSTAINED CELLS: 1 % (ref 0–4)
LYMPHOCYTES ABSOLUTE COUNT: 0.7 10*9/L — ABNORMAL LOW (ref 1.5–5.0)
LYMPHOCYTES RELATIVE PERCENT: 3 %
MEAN CORPUSCULAR HEMOGLOBIN CONC: 31.7 g/dL (ref 31.0–37.0)
MEAN CORPUSCULAR HEMOGLOBIN: 27.9 pg (ref 26.0–34.0)
MEAN CORPUSCULAR VOLUME: 87.9 fL (ref 80.0–100.0)
MEAN PLATELET VOLUME: 8.6 fL (ref 7.0–10.0)
MONOCYTES ABSOLUTE COUNT: 0.7 10*9/L (ref 0.2–0.8)
MONOCYTES RELATIVE PERCENT: 2.9 %
NEUTROPHILS ABSOLUTE COUNT: 21.2 10*9/L — ABNORMAL HIGH (ref 2.0–7.5)
NEUTROPHILS RELATIVE PERCENT: 92.3 %
PLATELET COUNT: 352 10*9/L (ref 150–440)
RED BLOOD CELL COUNT: 3.69 10*12/L — ABNORMAL LOW (ref 4.50–5.90)
RED CELL DISTRIBUTION WIDTH: 17.9 % — ABNORMAL HIGH (ref 12.0–15.0)
WBC ADJUSTED: 23 10*9/L — ABNORMAL HIGH (ref 4.5–11.0)

## 2020-10-01 LAB — SLIDE REVIEW

## 2020-10-01 LAB — URINALYSIS
BACTERIA: NONE SEEN /HPF
BILIRUBIN UA: NEGATIVE
BLOOD UA: NEGATIVE
GLUCOSE UA: NEGATIVE
KETONES UA: NEGATIVE
LEUKOCYTE ESTERASE UA: NEGATIVE
NITRITE UA: NEGATIVE
PH UA: 5 (ref 5.0–9.0)
PROTEIN UA: NEGATIVE
RBC UA: <1 /HPF (ref <=3)
SPECIFIC GRAVITY UA: 1.026 (ref 1.003–1.030)
SQUAMOUS EPITHELIAL: <1 /HPF (ref 0–5)
UROBILINOGEN UA: 0.2
WBC UA: <1 /HPF (ref <=2)

## 2020-10-01 LAB — COMPREHENSIVE METABOLIC PANEL
ALBUMIN: 2.9 g/dL — ABNORMAL LOW (ref 3.4–5.0)
ALKALINE PHOSPHATASE: 282 U/L — ABNORMAL HIGH (ref 46–116)
ALT (SGPT): 11 U/L (ref 10–49)
ANION GAP: 7 mmol/L (ref 5–14)
AST (SGOT): 27 U/L (ref <=34)
BILIRUBIN TOTAL: 0.2 mg/dL — ABNORMAL LOW (ref 0.3–1.2)
BLOOD UREA NITROGEN: 12 mg/dL (ref 9–23)
BUN / CREAT RATIO: 14
CALCIUM: 8.6 mg/dL — ABNORMAL LOW (ref 8.7–10.4)
CHLORIDE: 100 mmol/L (ref 98–107)
CO2: 28 mmol/L (ref 20.0–31.0)
CREATININE: 0.86 mg/dL
EGFR CKD-EPI AA MALE: >90 mL/min/{1.73_m2} (ref >=60)
EGFR CKD-EPI NON-AA MALE: >90 mL/min/{1.73_m2} (ref >=60)
GLUCOSE RANDOM: 90 mg/dL (ref 70–179)
POTASSIUM: 4.3 mmol/L (ref 3.4–4.5)
PROTEIN TOTAL: 6.3 g/dL (ref 5.7–8.2)
SODIUM: 135 mmol/L (ref 135–145)

## 2020-10-01 NOTE — Unmapped (Signed)
Paged Dr. Tye Maryland concerning pt's fever and chills. Wants pt to have labs drawn.(CBC with diff, CMP, Blood cultures x2, UA) Pt is coming to [facility] for labs and will wait for results. Will then let pt know if he can go home or will need to go to ED.Pt and his wife verbalize understanding of the plan and will be on there way to [facility] ASAP. Will call back with any further needs.

## 2020-10-01 NOTE — Unmapped (Signed)
Appt scheduled as requested.    Thanks,  [employer]

## 2020-10-01 NOTE — Unmapped (Signed)
Left vm informing patient of appt pn 10/16/20.

## 2020-10-01 NOTE — Unmapped (Signed)
Lab orders placed.  

## 2020-10-01 NOTE — Unmapped (Signed)
Returned call to pt. States he received 2nd Moderna covid vaccine and pneumonia vaccine yesterday.Noticed last night he felt a little chilled. Around 1 am he was feeling this way and checked his temp and it was 100.2. He took a couple of tylenol and went back to bed.He awoke around 8 am and his temp was 102.2 with chills.He took 2 tylenol. Now temp is 102.4. Denies any other issue other than the fever and chills. No malaise, poor appetite, n/v,diarrhea, congestion,constipation.    Pharmacist told him with taking both shots he could have a fever.He is scheduled for chemo again tomorrow.Let him know I will notify team for their recommendation.

## 2020-10-01 NOTE — Unmapped (Signed)
Hi,     Manuel White has contacted the Communication Center in regards to the following symptom:     Fever > 100.5oF and Chills    Patient recently completed his second round of the Mederna Vaccination and phenomena shot on Wednesday 09/30/20. Patient has had  a fever over 100oF last night and this morning. He is scheduled for chemo treatment on 10/02/20 and wants to make sure symptoms will not effect his treatment.    Please contact Zarin Knupp at [phone]    Check Indicates criteria has been reviewed and confirmed with the patient:    [x]  Preferred Name   [x]  DOB and/or MR#  [x]  Preferred Contact Method  [x]  Phone Number(s)   []  MyChart     A page or telephone call has been made to the corresponding clinic.     Thank you,  Jannette Spanner   [facility]   [phone]

## 2020-10-01 NOTE — Unmapped (Signed)
1400: Pt's port accessed, labs drawn and one set of blood cultures. Port Heparin locked. Second set of cultures drawn via butterfly from left hand.

## 2020-10-02 ENCOUNTER — Encounter
Admit: 2020-10-02 | Discharge: 2020-10-03 | Payer: PRIVATE HEALTH INSURANCE | Attending: Internal Medicine | Primary: Internal Medicine

## 2020-10-02 ENCOUNTER — Encounter: Admit: 2020-10-02 | Discharge: 2020-10-03 | Payer: PRIVATE HEALTH INSURANCE

## 2020-10-02 ENCOUNTER — Encounter
Admit: 2020-10-02 | Discharge: 2020-10-03 | Payer: PRIVATE HEALTH INSURANCE | Attending: Registered" | Primary: Registered"

## 2020-10-02 DIAGNOSIS — Z5111 Encounter for antineoplastic chemotherapy: Principal | ICD-10-CM

## 2020-10-02 DIAGNOSIS — C189 Malignant neoplasm of colon, unspecified: Principal | ICD-10-CM

## 2020-10-02 DIAGNOSIS — F329 Major depressive disorder, single episode, unspecified: Principal | ICD-10-CM

## 2020-10-02 DIAGNOSIS — K56609 Unspecified intestinal obstruction, unspecified as to partial versus complete obstruction: Principal | ICD-10-CM

## 2020-10-02 DIAGNOSIS — Z803 Family history of malignant neoplasm of breast: Principal | ICD-10-CM

## 2020-10-02 DIAGNOSIS — C7951 Secondary malignant neoplasm of bone: Principal | ICD-10-CM

## 2020-10-02 DIAGNOSIS — I10 Essential (primary) hypertension: Principal | ICD-10-CM

## 2020-10-02 DIAGNOSIS — I471 Supraventricular tachycardia: Principal | ICD-10-CM

## 2020-10-02 DIAGNOSIS — C7800 Secondary malignant neoplasm of unspecified lung: Principal | ICD-10-CM

## 2020-10-02 DIAGNOSIS — C787 Secondary malignant neoplasm of liver and intrahepatic bile duct: Principal | ICD-10-CM

## 2020-10-02 DIAGNOSIS — F419 Anxiety disorder, unspecified: Principal | ICD-10-CM

## 2020-10-02 DIAGNOSIS — Z713 Dietary counseling and surveillance: Principal | ICD-10-CM

## 2020-10-02 DIAGNOSIS — N400 Enlarged prostate without lower urinary tract symptoms: Principal | ICD-10-CM

## 2020-10-02 DIAGNOSIS — I4891 Unspecified atrial fibrillation: Principal | ICD-10-CM

## 2020-10-02 LAB — CBC W/ AUTO DIFF
BASOPHILS ABSOLUTE COUNT: 0.1 10*9/L (ref 0.0–0.1)
BASOPHILS RELATIVE PERCENT: 0.7 %
EOSINOPHILS ABSOLUTE COUNT: 0.1 10*9/L (ref 0.0–0.4)
EOSINOPHILS RELATIVE PERCENT: 0.4 %
HEMATOCRIT: 31.8 % — ABNORMAL LOW (ref 41.0–53.0)
HEMOGLOBIN: 9.9 g/dL — ABNORMAL LOW (ref 13.5–17.5)
LARGE UNSTAINED CELLS: 2 % (ref 0–4)
LYMPHOCYTES ABSOLUTE COUNT: 0.9 10*9/L — ABNORMAL LOW (ref 1.5–5.0)
LYMPHOCYTES RELATIVE PERCENT: 5.4 %
MEAN CORPUSCULAR HEMOGLOBIN CONC: 31.2 g/dL (ref 31.0–37.0)
MEAN CORPUSCULAR HEMOGLOBIN: 27.5 pg (ref 26.0–34.0)
MEAN CORPUSCULAR VOLUME: 88.2 fL (ref 80.0–100.0)
MEAN PLATELET VOLUME: 8.3 fL (ref 7.0–10.0)
MONOCYTES ABSOLUTE COUNT: 0.8 10*9/L (ref 0.2–0.8)
MONOCYTES RELATIVE PERCENT: 4.4 %
NEUTROPHILS ABSOLUTE COUNT: 15.3 10*9/L — ABNORMAL HIGH (ref 2.0–7.5)
NEUTROPHILS RELATIVE PERCENT: 87.3 %
PLATELET COUNT: 343 10*9/L (ref 150–440)
RED BLOOD CELL COUNT: 3.6 10*12/L — ABNORMAL LOW (ref 4.50–5.90)
RED CELL DISTRIBUTION WIDTH: 17.8 % — ABNORMAL HIGH (ref 12.0–15.0)
WBC ADJUSTED: 17.5 10*9/L — ABNORMAL HIGH (ref 4.5–11.0)

## 2020-10-02 LAB — COMPREHENSIVE METABOLIC PANEL
ALBUMIN: 2.3 g/dL — ABNORMAL LOW (ref 3.4–5.0)
ALKALINE PHOSPHATASE: 243 U/L — ABNORMAL HIGH (ref 46–116)
ALT (SGPT): 16 U/L (ref 10–49)
ANION GAP: 10 mmol/L (ref 5–14)
AST (SGOT): 29 U/L (ref <=34)
BILIRUBIN TOTAL: 0.2 mg/dL — ABNORMAL LOW (ref 0.3–1.2)
BLOOD UREA NITROGEN: 15 mg/dL (ref 9–23)
BUN / CREAT RATIO: 18
CALCIUM: 8.3 mg/dL — ABNORMAL LOW (ref 8.7–10.4)
CHLORIDE: 99 mmol/L (ref 98–107)
CO2: 28 mmol/L (ref 20.0–31.0)
CREATININE: 0.82 mg/dL
EGFR CKD-EPI AA MALE: >90 mL/min/{1.73_m2} (ref >=60)
EGFR CKD-EPI NON-AA MALE: >90 mL/min/{1.73_m2} (ref >=60)
GLUCOSE RANDOM: 94 mg/dL (ref 70–179)
POTASSIUM: 3.9 mmol/L (ref 3.5–5.1)
PROTEIN TOTAL: 5.4 g/dL — ABNORMAL LOW (ref 5.7–8.2)
SODIUM: 137 mmol/L (ref 135–145)

## 2020-10-02 LAB — SLIDE REVIEW

## 2020-10-02 LAB — CEA: CARCINOEMBRYONIC ANTIGEN: 261.2 ng/mL — ABNORMAL HIGH (ref 0.0–5.0)

## 2020-10-02 LAB — MAGNESIUM: MAGNESIUM: 1.7 mg/dL (ref 1.6–2.6)

## 2020-10-02 MED ADMIN — ondansetron (ZOFRAN) tablet 24 mg: 24 mg | ORAL | @ 16:00:00 | Stop: 2020-10-02

## 2020-10-02 MED ADMIN — oxaliplatin (ELOXATIN) 132.6 mg in dextrose 5 % 250 mL chemo infusion: 85 mg/m2 | INTRAVENOUS | @ 18:00:00 | Stop: 2020-10-02

## 2020-10-02 MED ADMIN — bevacizumab-bvzr (ZIRABEV) 241.5 mg in sodium chloride (NS) 0.9 % 100 mL IVPB: 5 mg/kg | INTRAVENOUS | @ 20:00:00 | Stop: 2020-10-02

## 2020-10-02 MED ADMIN — fluorouracil (ADRUCIL) 3,750 mg in sodium chloride (NS) 0.9 % 46-hr infusion CADD: 2400 mg/m2 | INTRAVENOUS | @ 20:00:00 | Stop: 2020-10-03

## 2020-10-02 MED ADMIN — leucovorin 312 mg in dextrose 5 % 50 mL IVPB: 200 mg/m2 | INTRAVENOUS | @ 18:00:00 | Stop: 2020-10-02

## 2020-10-02 MED ADMIN — irinotecan (CAMPTOSAR) 257.4 mg in dextrose 5 % 500 mL IVPB: 165 mg/m2 | INTRAVENOUS | @ 17:00:00 | Stop: 2020-10-02

## 2020-10-02 MED ADMIN — dextrose 5 % infusion: 100 mL/h | INTRAVENOUS | @ 17:00:00 | Stop: 2020-10-02

## 2020-10-02 MED ADMIN — dexAMETHasone (DECADRON) tablet 20 mg: 20 mg | ORAL | @ 16:00:00 | Stop: 2020-10-02

## 2020-10-02 MED ADMIN — sodium chloride (NS) 0.9 % infusion: 100 mL/h | INTRAVENOUS | @ 20:00:00 | Stop: 2020-10-03

## 2020-10-02 NOTE — Unmapped (Signed)
Patient arrived to chair 42.  No complaints noted.  Access of port intact positive blood return.     CADD pump connected at 1526 . All clamps opened, green light flashing, pump reads running, and home supplies sent home with patient.     Patient completed and tolerated treatment.  AVS declined and patient discharged to home.

## 2020-10-02 NOTE — Unmapped (Signed)
Lab on 10/02/2020   Component Date Value Ref Range Status   ??? Sodium 10/02/2020 137  135 - 145 mmol/L Final   ??? Potassium 10/02/2020 3.9  3.5 - 5.1 mmol/L Final   ??? Chloride 10/02/2020 99  98 - 107 mmol/L Final   ??? Anion Gap 10/02/2020 10  5 - 14 mmol/L Final   ??? CO2 10/02/2020 28.0  20.0 - 31.0 mmol/L Final   ??? BUN 10/02/2020 15  9 - 23 mg/dL Final   ??? Creatinine 10/02/2020 0.82  0.70 - 1.30 mg/dL Final   ??? BUN/Creatinine Ratio 10/02/2020 18   Final   ??? EGFR CKD-EPI Non-African American,* 10/02/2020 >90  >=60 mL/min/1.82m2 Final   ??? EGFR CKD-EPI African American, Male 10/02/2020 >90  >=60 mL/min/1.10m2 Final   ??? Glucose 10/02/2020 94  70 - 179 mg/dL Final   ??? Calcium 29/56/2130 8.3* 8.7 - 10.4 mg/dL Final   ??? Albumin 86/57/8469 2.3* 3.4 - 5.0 g/dL Final   ??? Total Protein 10/02/2020 5.4* 5.7 - 8.2 g/dL Final   ??? Total Bilirubin 10/02/2020 0.2* 0.3 - 1.2 mg/dL Final   ??? AST 62/95/2841 29  <=34 U/L Final   ??? ALT 10/02/2020 16  10 - 49 U/L Final   ??? Alkaline Phosphatase 10/02/2020 243* 46 - 116 U/L Final   ??? Magnesium 10/02/2020 1.7  1.6 - 2.6 mg/dL Final   ??? CEA 32/44/0102 261.2* 0.0 - 5.0 ng/mL Final   ??? WBC 10/02/2020 17.5* 4.5 - 11.0 10*9/L Final   ??? RBC 10/02/2020 3.60* 4.50 - 5.90 10*12/L Final   ??? HGB 10/02/2020 9.9* 13.5 - 17.5 g/dL Final   ??? HCT 72/53/6644 31.8* 41.0 - 53.0 % Final   ??? MCV 10/02/2020 88.2  80.0 - 100.0 fL Final   ??? MCH 10/02/2020 27.5  26.0 - 34.0 pg Final   ??? MCHC 10/02/2020 31.2  31.0 - 37.0 g/dL Final   ??? RDW 03/47/4259 17.8* 12.0 - 15.0 % Final   ??? MPV 10/02/2020 8.3  7.0 - 10.0 fL Final   ??? Platelet 10/02/2020 343  150 - 440 10*9/L Final   ??? Neutrophils % 10/02/2020 87.3  % Final   ??? Lymphocytes % 10/02/2020 5.4  % Final   ??? Monocytes % 10/02/2020 4.4  % Final   ??? Eosinophils % 10/02/2020 0.4  % Final   ??? Basophils % 10/02/2020 0.7  % Final   ??? Absolute Neutrophils 10/02/2020 15.3* 2.0 - 7.5 10*9/L Final   ??? Absolute Lymphocytes 10/02/2020 0.9* 1.5 - 5.0 10*9/L Final   ??? Absolute Monocytes 10/02/2020 0.8  0.2 - 0.8 10*9/L Final   ??? Absolute Eosinophils 10/02/2020 0.1  0.0 - 0.4 10*9/L Final   ??? Absolute Basophils 10/02/2020 0.1  0.0 - 0.1 10*9/L Final   ??? Large Unstained Cells 10/02/2020 2  0 - 4 % Final   ??? Macrocytosis 10/02/2020 Slight* Not Present Final   ??? Anisocytosis 10/02/2020 Slight* Not Present Final   ??? Hypochromasia 10/02/2020 Moderate* Not Present Final   ??? Spec Gravity/POC 10/02/2020 >=1.030  1.003 - 1.030 Final   ??? PH/POC 10/02/2020 5.5  5.0 - 9.0 Final   ??? Leuk Esterase/POC 10/02/2020 Negative  Negative Final   ??? Nitrite/POC 10/02/2020 Negative  Negative Final   ??? Protein/POC 10/02/2020 Trace* Negative Final   ??? UA Glucose/POC 10/02/2020 Negative  Negative Final   ??? Ketones, POC 10/02/2020 Negative  Negative Final   ??? Bilirubin/POC 10/02/2020 Negative  Negative Final   ??? Blood/POC  10/02/2020 Negative  Negative Final   ??? Urobilinogen/POC 10/02/2020 0.2  0.2 - 1.0 mg/dL Final

## 2020-10-02 NOTE — Unmapped (Signed)
Urine collected and sent for processing.

## 2020-10-02 NOTE — Unmapped (Signed)
Service:  Hematology and Oncology  Patient Name: Manuel White  Patient Age: 59 y.o.  Encounter Date: 10/02/2020    PRIMARY CARE PHYSICIAN  Hadley Pen, MD  [address] [facility]  [place] [address]    CONSULTING PHYSICIANS  Lesia Sago, Md  [address]  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 Large bowel obstruction (rectosigmoid obstructing mass), s/p loop diverting ostomy 08-03-20  3. Weight loss  4. A fib with one episode SVT inpatient    - C3 of FOLFOXIRI + Avastin today. Pt is tolerating therapy well thus far with some cold intolerance, fatigue during the first 3 days after his infusions.   - C4D1, C5D1 scheduled for 10/16/20 and 11/13/20. We will request that they be moved to 10/19/20 and 11/16/20 to allow him to be off 5-FU pump for the Holidays.  - He will still need colonoscopy at some point in future, as he has not had one to date and we cannot rule out synchronous lesions  - Abdominal pain: largely resolved, no longer requiring pain medication  - Nausea: prescribed zofran in addition to home phenergan  - Weight loss: nutrition referral, wt stable today at 104 lbs.  - COVID -19 risk:   Moderna #2 09/30/20. EMR updated. Brief febrile episode after w/o localizing infectious symptoms. Discussed that timing of third Moderna dose can be as soon as within 28 days of the second.  - Continue Toprol XL 50 mg daily, not currently on systemic anticoagulation (CHA2DS2-VASc = 1, HTN)    Isa Rankin, MD  H/O PGY 5    Seen and discussed with Dr. Tye Maryland  _____________________________________________________________________   ONCOLOGY HISTORY  Diagnosed age 18 (Sept 2021) with metastatic colorectal cancer.  PMH is notable for kidney stones and A fib.  Reports intermittent diarrhea and abdominal pain since March 2021. CT 07-29-20 with colon mass.  He presented to [facility] the weekend prior to planned palliative colonic stent for large bowel obstruction, and underwent flex sig with diverting loop colonoscopy 08-03-20.  Biopsy consistent with moderately differentiated adenocarcinoma (MSS). Staging CT (OSH) notable for liver, lung and possible L iliac met.    INTERVAL HISTORY:  Accompanied by his wife. Feels well since last chemotherapy except for fatigue for the first 3 days after infusion w/ cycles 1, 2 and one day of mild abdominal cramping/diarrhea (increased ostomy output) last week which resolved with Imodium. His fatigue typically improves after he is able to disconnect from the 5-FU pump and he is able to resume typical daily activities. Appetite is good, wt stable. Energy is adequate though he does take a short afternoon nap daily. Pt reports fever to 101.0 24 hours after recent Moderna Covid vaccine #2. He was evaluated in infusion clinic with normal VS, leukocytosis present in setting of recent G-CSF administration. No localizing  symptoms. Blood Cx NGTD. Today when he woke up he felt back to his baseline.  ??  A 10 point ROS was performed and negative except as noted above in the HPI.    _____________________________________________  OTHER PAST MEDICAL HISTORY / CURRENT PROBLEM LIST    Afib/SVT  HTN  MDD/Anxiety  BPH    Past Medical History:   Diagnosis Date   ??? BPH (benign prostatic hyperplasia)    ??? Colon cancer (CMS-HCC)    ??? Hypertension       _____________________________________________________________________  ALLERGIES    Patient has no known allergies.     _____________________________________________________________________  CURRENT MEDICATIONS    I am having [name] maintain his citalopram, docusate sodium, LORazepam, metoprolol succinate, promethazine, tamsulosin, fluticasone propionate, oxyCODONE, acetaminophen, multivitamin, loperamide, (heparin, porcine (PF)), sodium chloride, pegfilgrastim-bmez, dexAMETHasone, HYDROcodone-acetaminophen, and SYSTANE BALANCE.     _____________________________________________________________________  HABITS    SOCIAL HISTORY  Social History     Socioeconomic History   ??? Marital status: Married     Spouse name: Not on file   ??? Number of children: Not on file   ??? Years of education: Not on file   ??? Highest education level: Not on file   Occupational History   ??? Not on file   Tobacco Use   ??? Smoking status: Never Smoker   ??? Smokeless tobacco: Never Used   Substance and Sexual Activity   ??? Alcohol use: Not on file   ??? Drug use: Not on file   ??? Sexual activity: Not on file   Other Topics Concern   ??? Not on file   Social History Narrative   ??? Not on file     Social Determinants of Health     Financial Resource Strain: Low Risk    ??? Difficulty of Paying Living Expenses: Not hard at all   Food Insecurity: No Food Insecurity   ??? Worried About Programme researcher, broadcasting/film/video in the Last Year: Never true   ??? Ran Out of Food in the Last Year: Never true   Transportation Needs: No Transportation Needs   ??? Lack of Transportation (Medical): No   ??? Lack of Transportation (Non-Medical): No   Physical Activity: Not on file   Stress: Not on file   Social Connections: Not on file         FAMILY HISTORY  Mother- Uterine, Breast Ca, deceased     _____________________________________________________________________  PHYSICAL EXAM    VITAL SIGNS: BP 111/73  - Pulse 89  - Temp 36.8 ??C (98.2 ??F) (Temporal)  - Resp 16  - Wt 47.5 kg (104 lb 12.8 oz)  - SpO2 100%  - BMI 14.62 kg/m??   Gen: Thin man, sitting comfortably on couch, NAD  HEENT: PERRL, non-icteric sclera, no cervical or supraclavicular LAD  CV: RRR, no M/R/G, no LE edema  Pulm: CTAB  AB: +BS, soft, ND, ND, ostomy in place with normal output  Skin: no rash, lesions, port site w/o tenderness, redness, induration  Psych: normal affect   _____________________________________________________________________  LABS    Lab on 10/02/2020   Component Date Value   ??? Sodium 10/02/2020 137    ??? Potassium 10/02/2020 3.9    ??? Chloride 10/02/2020 99    ??? Anion Gap 10/02/2020 10    ??? CO2 10/02/2020 28.0    ??? BUN 10/02/2020 15    ??? Creatinine 10/02/2020 0.82    ??? BUN/Creatinine Ratio 10/02/2020 18    ??? EGFR CKD-EPI Non-African* 10/02/2020 >  90    ??? EGFR CKD-EPI African Ame* 10/02/2020 >90    ??? Glucose 10/02/2020 94    ??? Calcium 10/02/2020 8.3*   ??? Albumin 10/02/2020 2.3*   ??? Total Protein 10/02/2020 5.4*   ??? Total Bilirubin 10/02/2020 0.2*   ??? AST 10/02/2020 29    ??? ALT 10/02/2020 16    ??? Alkaline Phosphatase 10/02/2020 243*   ??? Magnesium 10/02/2020 1.7    ??? CEA 10/02/2020 261.2*   ??? WBC 10/02/2020 17.5*   ??? RBC 10/02/2020 3.60*   ??? HGB 10/02/2020 9.9*   ??? HCT 10/02/2020 31.8*   ??? MCV 10/02/2020 88.2    ??? [facility] 10/02/2020 27.5    ??? MCHC 10/02/2020 31.2    ??? RDW 10/02/2020 17.8*   ??? MPV 10/02/2020 8.3    ??? Platelet 10/02/2020 343    ??? Neutrophils % 10/02/2020 87.3    ??? Lymphocytes % 10/02/2020 5.4    ??? Monocytes % 10/02/2020 4.4    ??? Eosinophils % 10/02/2020 0.4    ??? Basophils % 10/02/2020 0.7    ??? Absolute Neutrophils 10/02/2020 15.3*   ??? Absolute Lymphocytes 10/02/2020 0.9*   ??? Absolute Monocytes 10/02/2020 0.8    ??? Absolute Eosinophils 10/02/2020 0.1    ??? Absolute Basophils 10/02/2020 0.1    ??? Large Unstained Cells 10/02/2020 2    ??? Macrocytosis 10/02/2020 Slight*   ??? Anisocytosis 10/02/2020 Slight*   ??? Hypochromasia 10/02/2020 Moderate*   ??? Smear Review Comments 10/02/2020 See Comment*   ??? Schistocytes 10/02/2020 Rare*   ??? Toxic Granulation 10/02/2020 Present*   ??? Toxic Vacuolation 10/02/2020 Present*   ??? Spec Gravity/POC 10/02/2020 >=1.030    ??? PH/POC 10/02/2020 5.5    ??? Leuk Esterase/POC 10/02/2020 Negative    ??? Nitrite/POC 10/02/2020 Negative    ??? Protein/POC 10/02/2020 Trace*   ??? UA Glucose/POC 10/02/2020 Negative    ??? Ketones, POC 10/02/2020 Negative    ??? Bilirubin/POC 10/02/2020 Negative    ??? Blood/POC 10/02/2020 Negative    ??? Urobilinogen/POC 10/02/2020 0.2        RADIOLOGY / OTHER DIAGNOSTIC STUDIES  Flexible sigmoidoscopy 08-03-20:  Findings:       Hemorrhoids were found on perianal exam.       A fungating and completely obstructing large mass was found at 16 cm        proximal to the anus. No bleeding was present. Biopsies were taken with        a cold forceps for histology.                                                                                   Impression:            - Hemorrhoids found on perianal exam.                         - Likely malignant completely obstructing tumor at 16                          cm proximal to the anus. Biopsied.    PATHOLOGY  08-03-20:  Addendum  The purpose of this addendum is to report the results of immunohistochemical stains for mismatch repair proteins.    ??  Immunohistochemical stains for MLH1, MSH2, MSH6 and PMS2 were performed on block A1.  The tumor cells demonstrate appropriate nuclear expression for all four markers.  This is the normal phenotype and does NOT support a diagnosis of microsatellite instability/hereditary non-polyposis colorectal cancer (HNPCC).  Separate molecular testing for microsatellite instability markers will be performed by the [employer] (phone # [phone]) and reported separately.    ??  This result may have implications for therapy selection, as MSS/mismatch repair proficient tumors are less likely to respond to immune modulating agents like Pembrolizumab (1).  ??  1) Le DT, et al.?? [employer] J Med. 2015 Jun 25;372(26):2509-20. PMID: 09811914  ??  The original diagnoses are unchanged.  ??  Selected tissue block for possible future molecular pathology studies:  A1  ??  ??   Addendum electronically signed by Lyla Glassing, MD on 08/05/2020 at 1621   Final Diagnosis   A: Colon, rectosigmoid tumor, biopsy  - Invasive moderately differentiated adenocarcinoma associated with scant fragments of a tubulovillous adenoma with high-grade dysplasia  - No lymphovascular space invasion identified  - See comment         Imaging:  CT CAP 08-03-20 (OSH scan, reviewed here):  Large bowel obstruction due to a 4.8 cm mass at the rectosigmoid junction, likely primary colonic malignancy.  ??  Multifocal hepatic masses and enlarged, heterogeneous left para-aortic lymph nodes, likely metastatic.   ??  Possible osseous metastasis in the left medial iliac bone. Bone scan or MSK protocol pelvic MRI could be considered to more accurately characterize.  ??  Innumerable pulmonary nodules, better visualized on concurrent chest CT.    Bone scan 09-18-20:  Slight increased uptake in the medial aspect of the of the left iliac bone corresponding to the lucency surrounded by sclerotic change seen on CT dated 07/29/20.

## 2020-10-02 NOTE — Unmapped (Addendum)
It was a pleasure seeing you today.    You will proceed with your treatment as scheduled today. For your next treatment we have requested it be moved to the Monday after Thanksgiving. We have also requested that your treatment scheduled the Friday before Christmas be moved to the following Monday.    We will plan to check in with a video visit with Dr. Tye Maryland on 10/30/20    For appointments & questions Monday through Friday 8 AM???5 PM     Please call [phone] or Toll free [phone]    For urgent clinical needs on Nights, Weekends or Holidays  Call [phone] and ask for the oncologist on call.     For appointment changes please contact during normal business hours.     Please visit PrivacyFever.cz, a resource created just for family members and caregivers.  This website lists support services, how and where to ask for help. It has tools to assist you as you help [place] care for your loved one.    [facility]  [address]  [place], [place] [address]  [email or web address]

## 2020-10-02 NOTE — Unmapped (Signed)
Contacted patient by phone and spoke with patient/caregiver (spouse) regarding plan for self-administration of Ziextenzo at home associated with FOLFOXIRI plus bevacizumab treatment. Reviewed injection technique per package insert instructions and that the contents of only (1) syringe of Ziextenzo (6 mg) should be administered each chemotherapy cycle on day 4 (24 hours after the completion of fluorouracil). Counseling performed previously by [facility] as well. Patient/spouse confirmed understanding of the plan.      I spent 12 minutes on the phone with the patient on the date of service. I spent an additional 10 minutes on pre- and post-visit activities on the date of service.     The patient was physically located in [place] or a state in which I am permitted to provide care. The patient and/or parent/guardian understood that s/he may incur co-pays and cost sharing, and agreed to the telemedicine visit. The visit was reasonable and appropriate under the circumstances given the patient's presentation at the time.    The patient and/or parent/guardian has been advised of the potential risks and limitations of this mode of treatment (including, but not limited to, the absence of in-person examination) and has agreed to be treated using telemedicine. The patient's/patient's family's questions regarding telemedicine have been answered.     If the visit was completed in an ambulatory setting, the patient and/or parent/guardian has also been advised to contact their provider???s office for worsening conditions, and seek emergency medical treatment and/or call 911 if the patient deems either necessary.

## 2020-10-02 NOTE — Unmapped (Signed)
0710: Patient presented for blood draw. Port was accessed from visit yesterday. Dressing was loose and without an antimicrobial gel pad or silver disk. Removed dressing, cleaned with CHG and and applied a new dressing with a CHG gel pad.

## 2020-10-02 NOTE — Unmapped (Signed)
NN called patient and LVM letting him know that Dr. Tye Maryland reviewed his lab results from today and said that everything ok good.  She cleared him to come back tomorrow on 11/12.

## 2020-10-05 NOTE — Unmapped (Signed)
ASSESSMENT    Past Medical History:   Diagnosis Date   ??? BPH (benign prostatic hyperplasia)    ??? Colon cancer (CMS-HCC)    ??? Hypertension       Pt is a 59 y.o. 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 Receiving FOLFIRI, cycle 3 today.    Wt Readings from Last 12 Encounters:   10/02/20 47.5 kg (104 lb 12.8 oz)   09/18/20 47.1 kg (103 lb 12.8 oz)   09/18/20 46.3 kg (102 lb 1.6 oz)   09/04/20 46.4 kg (102 lb 4.7 oz)   09/04/20 48.1 kg (106 lb)   09/04/20 46.4 kg (102 lb 3.2 oz)   08/21/20 48.3 kg (106 lb 8 oz)   08/03/20 54.2 kg (119 lb 7.8 oz)     Current Nutrition Impact Symptoms:   GI: reports normal colostomy output w/ one episode of diarrhea since last visit  Oral: taste changes continue to improve  Other: reports poor appetite X 1 day- after receiving Ziextenzo injection  ??  Nutrition History:   Allergies: NKFA  Home Diet:  Breakfast: eggs, bacon/ham, biscuit  Lunch: ham sandwich w/out crust, hamburger, or pinto beans w/ cornbread  Dinner: chicken and dumplings or cube steak with potatoes  Snack(s): peanut butter crackers, ice cream float  Beverages: ~ 32-48 oz Coke, 1 quart Powerade, occasional chocolate milk and sweet tea    Supplements: Ensure  X 1-1.5 daily      ASPEN/AND Malnutrition Screening:   Malnutrition Designation: Does not meet criteria at this time  -- Weight loss: wt stable over past month  -- Energy Intake: > 85% estimated nutrition needs- currently  -- Muscle Mass: moderate depletion based on some protrusion of clavicle  -- Fat Mass: not assessed at this visit  -- Fluid Accumulation: none observed  -- Functional Assessment: not assessed at this visit    Estimated nutritional needs   Energy: 2040 kcal ([place] [employer] Jeor BMR X 1.3 X 1.2)  Protein: 70-80 g (1.5-1.7 g/kg)  Fluids: 2000-2310 ml (BSA X 1300-1500 ml/m2)    NUTRITION DIAGNOSIS  [place]-3.2 Unintended weight loss related to symptoms associated with cancer dx, history of poor po intake as evidenced by severe weight loss over past month. - resolving    INTERVENTION(S)  1. Continue current diet.  2. Recommend Ensure Plus or equivalent once daily. Instructed pt to increase up to 6 bottles on days when po intake minimal.    MONITORING and EVALUATIONS  Nutrition Goals  1. Maintain wt within 1% each week- meeting  2. Meet >75% energy needs daily-meeting  3. Adequate hydration-meeting    Follow-up:  2 weeks, or as needed    Length of Visit:  15 minutes    Leilani Merl, MPH, RD, LDN  Pager #: [phone]

## 2020-10-19 ENCOUNTER — Encounter
Admit: 2020-10-19 | Discharge: 2020-10-20 | Payer: PRIVATE HEALTH INSURANCE | Attending: Registered" | Primary: Registered"

## 2020-10-19 ENCOUNTER — Other Ambulatory Visit: Admit: 2020-10-19 | Discharge: 2020-10-20 | Payer: PRIVATE HEALTH INSURANCE

## 2020-10-19 ENCOUNTER — Encounter: Admit: 2020-10-19 | Discharge: 2020-10-20 | Payer: PRIVATE HEALTH INSURANCE

## 2020-10-19 DIAGNOSIS — C189 Malignant neoplasm of colon, unspecified: Principal | ICD-10-CM

## 2020-10-19 DIAGNOSIS — C787 Secondary malignant neoplasm of liver and intrahepatic bile duct: Principal | ICD-10-CM

## 2020-10-19 DIAGNOSIS — Z5111 Encounter for antineoplastic chemotherapy: Principal | ICD-10-CM

## 2020-10-19 DIAGNOSIS — Z5112 Encounter for antineoplastic immunotherapy: Principal | ICD-10-CM

## 2020-10-19 DIAGNOSIS — Z713 Dietary counseling and surveillance: Principal | ICD-10-CM

## 2020-10-19 LAB — CBC W/ AUTO DIFF
BASOPHILS ABSOLUTE COUNT: 0.2 10*9/L — ABNORMAL HIGH (ref 0.0–0.1)
BASOPHILS RELATIVE PERCENT: 1 %
EOSINOPHILS ABSOLUTE COUNT: 0.2 10*9/L (ref 0.0–0.4)
EOSINOPHILS RELATIVE PERCENT: 1.4 %
HEMATOCRIT: 30 % — ABNORMAL LOW (ref 41.0–53.0)
HEMOGLOBIN: 9.5 g/dL — ABNORMAL LOW (ref 13.5–17.5)
LARGE UNSTAINED CELLS: 2 % (ref 0–4)
LYMPHOCYTES ABSOLUTE COUNT: 1.6 10*9/L (ref 1.5–5.0)
LYMPHOCYTES RELATIVE PERCENT: 9.4 %
MEAN CORPUSCULAR HEMOGLOBIN CONC: 31.7 g/dL (ref 31.0–37.0)
MEAN CORPUSCULAR HEMOGLOBIN: 28 pg (ref 26.0–34.0)
MEAN CORPUSCULAR VOLUME: 88.4 fL (ref 80.0–100.0)
MEAN PLATELET VOLUME: 7.5 fL (ref 7.0–10.0)
MONOCYTES ABSOLUTE COUNT: 0.9 10*9/L — ABNORMAL HIGH (ref 0.2–0.8)
MONOCYTES RELATIVE PERCENT: 5.1 %
NEUTROPHILS ABSOLUTE COUNT: 13.5 10*9/L — ABNORMAL HIGH (ref 2.0–7.5)
NEUTROPHILS RELATIVE PERCENT: 81.3 %
PLATELET COUNT: 624 10*9/L — ABNORMAL HIGH (ref 150–440)
RED BLOOD CELL COUNT: 3.39 10*12/L — ABNORMAL LOW (ref 4.50–5.90)
RED CELL DISTRIBUTION WIDTH: 18.5 % — ABNORMAL HIGH (ref 12.0–15.0)
WBC ADJUSTED: 16.6 10*9/L — ABNORMAL HIGH (ref 4.5–11.0)

## 2020-10-19 LAB — SLIDE REVIEW

## 2020-10-19 LAB — BILIRUBIN, TOTAL: BILIRUBIN TOTAL: <0.2 mg/dL — ABNORMAL LOW (ref 0.3–1.2)

## 2020-10-19 LAB — CREATININE
CREATININE: 0.76 mg/dL
EGFR CKD-EPI AA MALE: >90 mL/min/{1.73_m2} (ref >=60)
EGFR CKD-EPI NON-AA MALE: >90 mL/min/{1.73_m2} (ref >=60)

## 2020-10-19 LAB — POTASSIUM: POTASSIUM: 3.7 mmol/L (ref 3.4–4.5)

## 2020-10-19 LAB — MAGNESIUM: MAGNESIUM: 1.6 mg/dL (ref 1.6–2.6)

## 2020-10-19 MED ADMIN — leucovorin 312 mg in dextrose 5 % 50 mL IVPB: 200 mg/m2 | INTRAVENOUS | @ 19:00:00 | Stop: 2020-10-19

## 2020-10-19 MED ADMIN — dexAMETHasone (DECADRON) tablet 20 mg: 20 mg | ORAL | @ 17:00:00 | Stop: 2020-10-19

## 2020-10-19 MED ADMIN — bevacizumab-bvzr (ZIRABEV) 241.5 mg in sodium chloride (NS) 0.9 % 100 mL IVPB: 5 mg/kg | INTRAVENOUS | @ 21:00:00 | Stop: 2020-10-19

## 2020-10-19 MED ADMIN — sodium chloride (NS) 0.9 % infusion: 100 mL/h | INTRAVENOUS | @ 21:00:00 | Stop: 2020-10-20

## 2020-10-19 MED ADMIN — fluorouracil (ADRUCIL) 3,750 mg in sodium chloride (NS) 0.9 % 46-hr infusion CADD: 2400 mg/m2 | INTRAVENOUS | @ 21:00:00 | Stop: 2020-10-20

## 2020-10-19 MED ADMIN — irinotecan (CAMPTOSAR) 257.4 mg in dextrose 5 % 500 mL IVPB: 165 mg/m2 | INTRAVENOUS | @ 18:00:00 | Stop: 2020-10-19

## 2020-10-19 MED ADMIN — oxaliplatin (ELOXATIN) 132.6 mg in dextrose 5 % 250 mL chemo infusion: 85 mg/m2 | INTRAVENOUS | @ 19:00:00 | Stop: 2020-10-19

## 2020-10-19 MED ADMIN — ondansetron (ZOFRAN) tablet 24 mg: 24 mg | ORAL | @ 17:00:00 | Stop: 2020-10-19

## 2020-10-19 MED ADMIN — dextrose 5 % infusion: 100 mL/h | INTRAVENOUS | @ 18:00:00 | Stop: 2020-10-19

## 2020-10-19 NOTE — Unmapped (Unsigned)
Port accessed and labs drawn with no complications by [place] M.  Port flushed with saline.

## 2020-10-19 NOTE — Unmapped (Signed)
Lab on 10/19/2020   Component Date Value Ref Range Status   ??? Creatinine 10/19/2020 0.76  0.60 - 1.10 mg/dL Final   ??? EGFR CKD-EPI Non-African American,* 10/19/2020 >90  >=60 mL/min/1.51m2 Final   ??? EGFR CKD-EPI African American, Male 10/19/2020 >90  >=60 mL/min/1.26m2 Final   ??? Total Bilirubin 10/19/2020 <0.2* 0.3 - 1.2 mg/dL Final   ??? Potassium 82/95/6213 3.7  3.4 - 4.5 mmol/L Final   ??? Magnesium 10/19/2020 1.6  1.6 - 2.6 mg/dL Final   ??? WBC 08/65/7846 16.6* 4.5 - 11.0 10*9/L Final   ??? RBC 10/19/2020 3.39* 4.50 - 5.90 10*12/L Final   ??? HGB 10/19/2020 9.5* 13.5 - 17.5 g/dL Final   ??? HCT 96/29/5284 30.0* 41.0 - 53.0 % Final   ??? MCV 10/19/2020 88.4  80.0 - 100.0 fL Final   ??? MCH 10/19/2020 28.0  26.0 - 34.0 pg Final   ??? MCHC 10/19/2020 31.7  31.0 - 37.0 g/dL Final   ??? RDW 13/24/4010 18.5* 12.0 - 15.0 % Final   ??? MPV 10/19/2020 7.5  7.0 - 10.0 fL Final   ??? Platelet 10/19/2020 624* 150 - 440 10*9/L Final   ??? Neutrophils % 10/19/2020 81.3  % Final   ??? Lymphocytes % 10/19/2020 9.4  % Final   ??? Monocytes % 10/19/2020 5.1  % Final   ??? Eosinophils % 10/19/2020 1.4  % Final   ??? Basophils % 10/19/2020 1.0  % Final   ??? Absolute Neutrophils 10/19/2020 13.5* 2.0 - 7.5 10*9/L Final   ??? Absolute Lymphocytes 10/19/2020 1.6  1.5 - 5.0 10*9/L Final   ??? Absolute Monocytes 10/19/2020 0.9* 0.2 - 0.8 10*9/L Final   ??? Absolute Eosinophils 10/19/2020 0.2  0.0 - 0.4 10*9/L Final   ??? Absolute Basophils 10/19/2020 0.2* 0.0 - 0.1 10*9/L Final   ??? Large Unstained Cells 10/19/2020 2  0 - 4 % Final   ??? Microcytosis 10/19/2020 Slight* Not Present Final   ??? Macrocytosis 10/19/2020 Slight* Not Present Final   ??? Anisocytosis 10/19/2020 Moderate* Not Present Final   ??? Hypochromasia 10/19/2020 Marked* Not Present Final   ??? Spec Gravity/POC 10/19/2020 1.025  1.003 - 1.030 Final   ??? PH/POC 10/19/2020 5.0  5.0 - 9.0 Final   ??? Leuk Esterase/POC 10/19/2020 Negative  Negative Final   ??? Nitrite/POC 10/19/2020 Negative  Negative Final   ??? Protein/POC 10/19/2020 Negative  Negative Final   ??? UA Glucose/POC 10/19/2020 Negative  Negative Final   ??? Ketones, POC 10/19/2020 Negative  Negative Final   ??? Bilirubin/POC 10/19/2020 Negative  Negative Final   ??? Blood/POC 10/19/2020 Trace-intact* Negative Final   ??? Urobilinogen/POC 10/19/2020 0.2  0.2 - 1.0 mg/dL Final

## 2020-10-19 NOTE — Unmapped (Signed)
Patient arrived to chair 49.  No complaints noted.  Access of port intact with blood return. Pre-medicated per treatment plan orders. Old CADD pump placed in return bin for maintenance. Patient does not need atropine prior to irinotecan. Patient completed and tolerated infusion. Line checked for blood return and flushed. CADD pump connected. All clamps opened, green light flashing, pump reads running, and home supplies sent home with patient. AVS declined. Patient discharged to home, NAD.

## 2020-10-20 DIAGNOSIS — C787 Secondary malignant neoplasm of liver and intrahepatic bile duct: Principal | ICD-10-CM

## 2020-10-20 DIAGNOSIS — C189 Malignant neoplasm of colon, unspecified: Principal | ICD-10-CM

## 2020-10-20 NOTE — Unmapped (Signed)
Per request from patient, faxed order for ostomy supplies to [facility] [phone]    [facility] 1-Piece CTF Soft Convexity [phone] / [phone])  [employer] Adhesive Remover Wipes- (([id number])  [employer] Bibles Paste [phone] / [phone])  [employer] Stoma Powder- (050829/[id number])  [employer]pt Deodorant/Lubricant ([id number])    Patient states that he does not need no-sting barrier film pads or ostomy belt.

## 2020-10-20 NOTE — Unmapped (Signed)
[facility] Clinical Assessment & Refill Coordination Note    Manuel White, DOB: 12/11/1960  Phone: [phone] (home)     All above HIPAA information was verified with patient.     Was a Nurse, learning disability used for this call? No    Specialty Medication(s):   Hematology/Oncology: Ziextenzo 6 mg/ 0.6 mL     Current Outpatient Medications   Medication Sig Dispense Refill   ??? acetaminophen (TYLENOL) 500 MG tablet Take 1,000 mg by mouth every eight (8) hours as needed for pain.     ??? citalopram (CELEXA) 40 MG tablet Take 40 mg by mouth daily.     ??? dexAMETHasone (DECADRON) 4 MG tablet Take 2 tablets (8 mg total) by mouth on Days 2, 3, and 4 of each chemotherapy cycle. 24 tablet 5   ??? docusate sodium (COLACE) 100 MG capsule Take 100 mg by mouth two (2) times a day as needed for constipation. (Patient not taking: Reported on 09/18/2020)     ??? fluticasone propionate (FLONASE) 50 mcg/actuation nasal spray 1 spray into each nostril daily as needed for rhinitis.     ??? heparin, porcine, PF, 100 unit/mL Syrg Infuse 5 mL into a venous catheter every fourteen (14) days. For use while patient is on fluorouracil (5-FU) home infusion.   Flush IV catheter with heparin 5 mL as directed after final saline flush per SASH method and as needed for line maintenance. 4 each PRN   ??? HYDROcodone-acetaminophen (NORCO) 5-325 mg per tablet Take 1 tablet by mouth every six (6) hours as needed for pain. (Patient not taking: Reported on 09/18/2020) 30 tablet 0   ??? loperamide (IMODIUM) 2 mg capsule Take 2 capsules to start, then 1 capsule every 2 hours until diarrhea free for 12 hours. 60 capsule 11   ??? LORazepam (ATIVAN) 0.5 MG tablet Take by mouth. 1/2-1 (0.25mg -0.5mg ) EVERY 8 HOURS AS NEEDED     ??? metoprolol succinate (TOPROL-XL) 50 MG 24 hr tablet Take 50 mg by mouth daily.     ??? multivitamin (TAB-A-VITE/THERAGRAN) per tablet Take 1 tablet by mouth daily. Centrum Adult     ??? oxyCODONE (ROXICODONE) 5 MG immediate release tablet Take 1 tablet (5 mg total) by mouth every six (6) hours as needed for pain. (Patient not taking: Reported on 09/18/2020) 30 tablet 0   ??? pegfilgrastim-bmez (ZIEXTENZO) 6 mg/0.6 mL injection Inject the contents of one syringe (0.6 mL or 6 mg total) under the skin once per each chemotherapy cycle. Administer 24 hours after completion of chemotherapy. (Patient not taking: Reported on 09/18/2020) 1.2 mL 3   ??? promethazine (PHENERGAN) 12.5 MG tablet Take 12.5 mg by mouth every eight (8) hours as needed for nausea.     ??? propylene glycoL (SYSTANE BALANCE) 0.6 % Drop Apply 1 drop to eye nightly as needed.     ??? sodium chloride (NS) 0.9 % injection Infuse 10 mL into a venous catheter every fourteen (14) days. For use while patient is on fluorouracil (5-FU) home infusion.   Flush IV catheter with normal saline 10 mL prior to and after infusion followed by heparin flush per SASH method and as needed for line maintenance. 6 each PRN   ??? tamsulosin (FLOMAX) 0.4 mg capsule Take 0.4 mg by mouth daily.       No current facility-administered medications for this visit.        Changes to medications: Manuel White reports no changes at this time.    No Known Allergies  Changes to allergies: No    SPECIALTY MEDICATION ADHERENCE     Ziextenzo 6 mg/0.35mL: 1 (dose) injection on hand     Medication Adherence    Patient reported X missed doses in the last month: 0  Specialty Medication: Ziextenzo 6 mg/0.6 mL injection   Patient is on additional specialty medications: No  Informant: patient  Confirmed plan for next specialty medication refill: delivery by pharmacy          Specialty medication(s) dose(s) confirmed: Regimen is correct and unchanged.     Are there any concerns with adherence? No    Adherence counseling provided? Not needed    CLINICAL MANAGEMENT AND INTERVENTION      Clinical Benefit Assessment:    Do you feel the medicine is effective or helping your condition? Yes    Clinical Benefit counseling provided? Not needed    Adverse Effects Assessment:    Are you experiencing any side effects? Yes, patient reports experiencing change in taste/lack of appetite for about 24 hours after injection.. Side effect counseling provided: Can try Tums to neutralize stomach acids.  Change in taste could be due to chemo treatments as well    Are you experiencing difficulty administering your medicine? No    Quality of Life Assessment:    How many days over the past month did your Metastatic colon cancer to liver; chemo induced neutropenia  keep you from your normal activities? For example, brushing your teeth or getting up in the morning. 0    Have you discussed this with your provider? Not needed    Therapy Appropriateness:    Is therapy appropriate? Yes, therapy is appropriate and should be continued    DISEASE/MEDICATION-SPECIFIC INFORMATION      For patients on injectable medications: Patient currently has 1 doses left.  Next injection is scheduled for 10/22/20.    PATIENT SPECIFIC NEEDS     - Does the patient have any physical, cognitive, or cultural barriers? No    - Is the patient high risk? No    - Does the patient require a Care Management Plan? No     - Does the patient require physician intervention or other additional services (i.e. nutrition, smoking cessation, social work)? No      SHIPPING     Specialty Medication(s) to be Shipped:   Hematology/Oncology: Ziextenzo    Other medication(s) to be shipped: No additional medications requested for fill at this time     Changes to insurance: No    Delivery Scheduled: Yes, Expected medication delivery date: 11/03/20.     Medication will be delivered via UPS to the confirmed prescription address in [facility].    The patient will receive a drug information handout for each medication shipped and additional FDA Medication Guides as required.  Verified that patient has previously received a Conservation officer, historic buildings.    All of the patient's questions and concerns have been addressed.    Breck Coons [facility] Specialty Pharmacist

## 2020-10-20 NOTE — Unmapped (Signed)
ASSESSMENT    Past Medical History:   Diagnosis Date   ??? BPH (benign prostatic hyperplasia)    ??? Colon cancer (CMS-HCC)    ??? Hypertension       Pt is a 59 y.o. 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 Receiving FOLFIRI, cycle 4 today.    Wt Readings from Last 12 Encounters:   10/19/20 51.3 kg (113 lb 1.5 oz)   10/02/20 47.5 kg (104 lb 12.8 oz)   09/18/20 47.1 kg (103 lb 12.8 oz)   09/18/20 46.3 kg (102 lb 1.6 oz)   09/04/20 46.4 kg (102 lb 4.7 oz)   09/04/20 48.1 kg (106 lb)   09/04/20 46.4 kg (102 lb 3.2 oz)   08/21/20 48.3 kg (106 lb 8 oz)   08/03/20 54.2 kg (119 lb 7.8 oz)     Current Nutrition Impact Symptoms:   GI:??reports normal colostomy output w/ one episode of diarrhea since last visit  Oral:??taste changes continue to improve- bread tastes good again  Other:??reports poor appetite X 1 day- after receiving Ziextenzo injection  ??  Nutrition History:??  Allergies: NKFA  Breakfast:??eggs, bacon/ham, biscuit  Lunch:?? hamburger, [employer] meal, or pinto beans w/ cornbread  Dinner:??chicken and dumplings or cube steak with potatoes  Snack(s): peanut butter crackers, ice cream float, Oreos  Beverages:??~ 32-48 oz Coke, ~ 1 quart Powerade, 5-6 tall cups sweet tea  Supplements:??Ensure  X 1-1.5 daily    Pt endorses enjoying Thanksgiving meal last week- ate too much. Continues to remain active throughout day, working around house.  ??  ASPEN/AND Malnutrition Screening:??  Malnutrition Designation: Does not meet criteria at this time  -- Weight loss:??recent wt gain  -- Energy Intake:??>??85% estimated nutrition needs- currently  -- Muscle Mass:??moderate depletion based on some protrusion of clavicle  -- Fat Mass:??not assessed at this visit  -- Fluid Accumulation:??none observed  -- Functional Assessment:??not assessed at this visit    Estimated Nutrition Needs  Calories: 2100 kcal ([place] St Jeor BMR X 1.3 AF X 1.2 IF)  Protein: 77-87 g (1.5-1.7 g/kg)  Fluid: 2080-2400 ml (BSA X 1300-1500 ml/m2)    NUTRITION DIAGNOSIS  [place]-3.2 Unintended weight loss related to symptoms associated with cancer dx, history of poor po intake as evidenced by severe weight loss over past month. -resolved    INTERVENTION(S)  1. Continue current diet.  2. Recommend Ensure Plus as needed.    MONITORING and EVALUATIONS  1. Maintain??wt??within 1% each week- meeting  2. Meet >75% energy needs daily-meeting  3. Adequate hydration-meeting    Follow-up: Will follow-up as needed.     Encouraged pt and wife to contact RD if any questions/concerns arise. Contact info provided.    Length of Visit: 15 minutes    Leilani Merl, MPH, RD, LDN  Pager # [phone]

## 2020-10-22 NOTE — Unmapped (Signed)
Received voicemail from [employer]    Attempted to return call to address possible issues with ostomy supply order. Left 2 voicemails with [phone] (managed care services). Tried several times at [phone], with no answer.

## 2020-10-28 ENCOUNTER — Encounter
Admit: 2020-10-28 | Discharge: 2020-10-29 | Payer: PRIVATE HEALTH INSURANCE | Attending: Internal Medicine | Primary: Internal Medicine

## 2020-10-28 DIAGNOSIS — C787 Secondary malignant neoplasm of liver and intrahepatic bile duct: Principal | ICD-10-CM

## 2020-10-28 DIAGNOSIS — C189 Malignant neoplasm of colon, unspecified: Principal | ICD-10-CM

## 2020-10-28 MED ORDER — ONDANSETRON HCL 8 MG TABLET
ORAL_TABLET | 5 refills | 0 days | Status: CP
Start: 2020-10-28 — End: ?

## 2020-10-28 MED ORDER — PROMETHAZINE 12.5 MG TABLET
ORAL_TABLET | Freq: Three times a day (TID) | ORAL | 3 refills | 10 days | Status: CP | PRN
Start: 2020-10-28 — End: ?

## 2020-10-28 MED ORDER — AMOXICILLIN 875 MG-POTASSIUM CLAVULANATE 125 MG TABLET
ORAL_TABLET | Freq: Two times a day (BID) | ORAL | 0 refills | 14 days | Status: CP
Start: 2020-10-28 — End: 2020-11-04

## 2020-10-28 NOTE — Unmapped (Signed)
Pt reports a scant amount of blood when blowing his nose - ocassional and has not gotten any worse.     Pt had questions about his ostomy supplies, paperwork for his cancer policy and some insurance questions. Notified Nonie Hoyer to help with ostomy and paperwork questions and notified financial counseling to assist with his insurance questions.     Dr. Tye Maryland working in [place] today but completed this video/telehealth visit.

## 2020-10-28 NOTE — Unmapped (Signed)
Called [facility] to check on ostomy supply order. Supplies to be delivered today to patient by end of day via Fed[employer].    Called patient to make him aware. Patient also asked for assistance with a cancer insurance policy as well as some additional paperwork including denials. Provided patient fax number for GI intake and advised him to send message to NN in MyChart once paperwork was faxed. He verbalized understanding and expressed appreciation for the call.

## 2020-10-28 NOTE — Unmapped (Signed)
Called patient to perform Nursing assessment and pre-video/phone visit.  Reviewed allergies, medications, medical and surgical history.  Completed outstanding screenings.  Patient verbalized time and date of upcoming visit.  No further questions or concerns voiced.

## 2020-10-28 NOTE — Unmapped (Signed)
Contacted patient to review antiemetic changes and prescriptions sent to his local pharmacy [facility]) for ondansetron and promethazine.    Fosaprepitant added to day 1 each chemotherapy cycle as a premedication and adjusted day 1 dexamethasone to 12 mg due to drug-drug interaction with fosaprepitant. New prescription routed for ondansetron 8 mg BID to be taken scheduled on days 2-4 (and up to 1 tablet PO Q8H PRN; patient aware to avoid ondansetron on day 1 of chemotherapy cycles). Continue scheduled dexamethasone 8 mg PO daily on days 2-4. Refill sent for promethazine as backup PRN option. Patient confirmed understanding.    Care coordination: 15 minutes

## 2020-10-29 NOTE — Unmapped (Signed)
Returned call to [employer]. Script for augmentin had quantity of 28 tabs. Pt is supposed to take 2 tabs daily for 7 days.checking to see if pt was supposed to receive 28 tabs instead of 14. Will give 14 tabs for now and ask that team call back to clarify other 14.Let her know I will send this message to the team and will call her back.

## 2020-10-29 NOTE — Unmapped (Signed)
Hi,    Tina with [employer] contacted the [facility] today stating that the quantity and the directions do not match on the prescription that was sent over. Please contact Pharmacy back asap at [phone].    .Thank you,   Noland Fordyce  [facility]  [phone]

## 2020-11-02 ENCOUNTER — Encounter: Admit: 2020-11-02 | Discharge: 2020-11-03 | Payer: PRIVATE HEALTH INSURANCE

## 2020-11-02 DIAGNOSIS — C787 Secondary malignant neoplasm of liver and intrahepatic bile duct: Principal | ICD-10-CM

## 2020-11-02 DIAGNOSIS — C189 Malignant neoplasm of colon, unspecified: Principal | ICD-10-CM

## 2020-11-02 LAB — COMPREHENSIVE METABOLIC PANEL
ALBUMIN: 2.6 g/dL — ABNORMAL LOW (ref 3.4–5.0)
ALKALINE PHOSPHATASE: 228 U/L — ABNORMAL HIGH (ref 46–116)
ALT (SGPT): 26 U/L (ref 10–49)
ANION GAP: 4 mmol/L — ABNORMAL LOW (ref 5–14)
AST (SGOT): 27 U/L (ref <=34)
BILIRUBIN TOTAL: <0.2 mg/dL — ABNORMAL LOW (ref 0.3–1.2)
BLOOD UREA NITROGEN: 13 mg/dL (ref 9–23)
BUN / CREAT RATIO: 15
CALCIUM: 8.3 mg/dL — ABNORMAL LOW (ref 8.7–10.4)
CHLORIDE: 105 mmol/L (ref 98–107)
CO2: 30 mmol/L (ref 20.0–31.0)
CREATININE: 0.84 mg/dL
EGFR CKD-EPI AA MALE: >90 mL/min/{1.73_m2} (ref >=60)
EGFR CKD-EPI NON-AA MALE: >90 mL/min/{1.73_m2} (ref >=60)
GLUCOSE RANDOM: 91 mg/dL (ref 70–179)
POTASSIUM: 3.8 mmol/L (ref 3.5–5.1)
PROTEIN TOTAL: 5.6 g/dL — ABNORMAL LOW (ref 5.7–8.2)
SODIUM: 139 mmol/L (ref 135–145)

## 2020-11-02 LAB — CBC W/ AUTO DIFF
BASOPHILS ABSOLUTE COUNT: 0.1 10*9/L (ref 0.0–0.1)
BASOPHILS RELATIVE PERCENT: 0.6 %
EOSINOPHILS ABSOLUTE COUNT: 0.2 10*9/L (ref 0.0–0.4)
EOSINOPHILS RELATIVE PERCENT: 1.4 %
HEMATOCRIT: 29.3 % — ABNORMAL LOW (ref 41.0–53.0)
HEMOGLOBIN: 9.1 g/dL — ABNORMAL LOW (ref 13.5–17.5)
LARGE UNSTAINED CELLS: 2 % (ref 0–4)
LYMPHOCYTES ABSOLUTE COUNT: 1.6 10*9/L (ref 1.5–5.0)
LYMPHOCYTES RELATIVE PERCENT: 12.6 %
MEAN CORPUSCULAR HEMOGLOBIN CONC: 31.1 g/dL (ref 31.0–37.0)
MEAN CORPUSCULAR HEMOGLOBIN: 27.8 pg (ref 26.0–34.0)
MEAN CORPUSCULAR VOLUME: 89.3 fL (ref 80.0–100.0)
MEAN PLATELET VOLUME: 8.6 fL (ref 7.0–10.0)
MONOCYTES ABSOLUTE COUNT: 0.7 10*9/L (ref 0.2–0.8)
MONOCYTES RELATIVE PERCENT: 5.5 %
NEUTROPHILS ABSOLUTE COUNT: 9.8 10*9/L — ABNORMAL HIGH (ref 2.0–7.5)
NEUTROPHILS RELATIVE PERCENT: 78 %
PLATELET COUNT: 352 10*9/L (ref 150–440)
RED BLOOD CELL COUNT: 3.28 10*12/L — ABNORMAL LOW (ref 4.50–5.90)
RED CELL DISTRIBUTION WIDTH: 19.1 % — ABNORMAL HIGH (ref 12.0–15.0)
WBC ADJUSTED: 12.6 10*9/L — ABNORMAL HIGH (ref 4.5–11.0)

## 2020-11-02 LAB — SLIDE REVIEW

## 2020-11-02 LAB — MAGNESIUM: MAGNESIUM: 1.6 mg/dL (ref 1.6–2.6)

## 2020-11-02 LAB — CEA: CARCINOEMBRYONIC ANTIGEN: 64.1 ng/mL — ABNORMAL HIGH (ref 0.0–5.0)

## 2020-11-02 MED ADMIN — ondansetron (ZOFRAN) tablet 24 mg: 24 mg | ORAL | @ 18:00:00 | Stop: 2020-11-02

## 2020-11-02 MED ADMIN — irinotecan (CAMPTOSAR) 257.4 mg in dextrose 5 % 500 mL IVPB: 165 mg/m2 | INTRAVENOUS | @ 18:00:00 | Stop: 2020-11-02

## 2020-11-02 MED ADMIN — sodium chloride (NS) 0.9 % infusion: 100 mL/h | INTRAVENOUS | @ 22:00:00 | Stop: 2020-11-03

## 2020-11-02 MED ADMIN — oxaliplatin (ELOXATIN) 132.6 mg in dextrose 5 % 250 mL chemo infusion: 85 mg/m2 | INTRAVENOUS | @ 20:00:00 | Stop: 2020-11-02

## 2020-11-02 MED ADMIN — dextrose 5 % infusion: 100 mL/h | INTRAVENOUS | @ 18:00:00 | Stop: 2020-11-02

## 2020-11-02 MED ADMIN — fosaprepitant (EMEND) 150 mg in sodium chloride (NS) 0.9 % 100 mL IVPB: 150 mg | INTRAVENOUS | @ 18:00:00 | Stop: 2020-11-02

## 2020-11-02 MED ADMIN — dexAMETHasone (DECADRON) tablet 12 mg: 12 mg | ORAL | @ 18:00:00 | Stop: 2020-11-02

## 2020-11-02 MED ADMIN — bevacizumab-bvzr (ZIRABEV) 267.5 mg in sodium chloride (NS) 0.9 % 100 mL IVPB: 5 mg/kg | INTRAVENOUS | @ 22:00:00 | Stop: 2020-11-02

## 2020-11-02 MED ADMIN — fluorouracil (ADRUCIL) 3,750 mg in sodium chloride (NS) 0.9 % 46-hr infusion CADD: 2400 mg/m2 | INTRAVENOUS | @ 22:00:00 | Stop: 2020-11-03

## 2020-11-02 MED ADMIN — leucovorin 312 mg in dextrose 5 % 50 mL IVPB: 200 mg/m2 | INTRAVENOUS | @ 19:00:00 | Stop: 2020-11-02

## 2020-11-02 MED FILL — ZIEXTENZO 6 MG/0.6 ML SUBCUTANEOUS SYRINGE: 28 days supply | Qty: 1 | Fill #1 | Status: AC

## 2020-11-02 MED FILL — ZIEXTENZO 6 MG/0.6 ML SUBCUTANEOUS SYRINGE: 28 days supply | Qty: 1.2 | Fill #1

## 2020-11-02 NOTE — Unmapped (Unsigned)
Port accessed without difficulty in lab by Tiney Rouge, LPN and labs sent. BP, urine and weight was doneion lab also. Port flushed and CHG dressing applied for home infusion. Patient discharged from lab in no distress for infusion appointment.

## 2020-11-03 NOTE — Unmapped (Signed)
Pt to clinic for treatment.  All infusions completed, pt tolerated well.  CADD pump attached, started, and running, CADD pump home supplies sent home with pt.  Pt left ambulatory with spouse.

## 2020-11-03 NOTE — Unmapped (Signed)
Lab on 11/02/2020   Component Date Value Ref Range Status   ??? Sodium 11/02/2020 139  135 - 145 mmol/L Final   ??? Potassium 11/02/2020 3.8  3.5 - 5.1 mmol/L Final   ??? Chloride 11/02/2020 105  98 - 107 mmol/L Final   ??? Anion Gap 11/02/2020 4* 5 - 14 mmol/L Final   ??? CO2 11/02/2020 30.0  20.0 - 31.0 mmol/L Final   ??? BUN 11/02/2020 13  9 - 23 mg/dL Final   ??? Creatinine 11/02/2020 0.84  0.60 - 1.10 mg/dL Final   ??? BUN/Creatinine Ratio 11/02/2020 15   Final   ??? EGFR CKD-EPI Non-African American,* 11/02/2020 >90  >=60 mL/min/1.41m2 Final   ??? EGFR CKD-EPI African American, Male 11/02/2020 >90  >=60 mL/min/1.63m2 Final   ??? Glucose 11/02/2020 91  70 - 179 mg/dL Final   ??? Calcium 16/08/9603 8.3* 8.7 - 10.4 mg/dL Final   ??? Albumin 54/07/8118 2.6* 3.4 - 5.0 g/dL Final   ??? Total Protein 11/02/2020 5.6* 5.7 - 8.2 g/dL Final   ??? Total Bilirubin 11/02/2020 <0.2* 0.3 - 1.2 mg/dL Final   ??? AST 14/78/2956 27  <=34 U/L Final   ??? ALT 11/02/2020 26  10 - 49 U/L Final   ??? Alkaline Phosphatase 11/02/2020 228* 46 - 116 U/L Final   ??? Magnesium 11/02/2020 1.6  1.6 - 2.6 mg/dL Final   ??? CEA 21/30/8657 64.1* 0.0 - 5.0 ng/mL Final   ??? WBC 11/02/2020 12.6* 4.5 - 11.0 10*9/L Final   ??? RBC 11/02/2020 3.28* 4.50 - 5.90 10*12/L Final   ??? HGB 11/02/2020 9.1* 13.5 - 17.5 g/dL Final   ??? HCT 84/69/6295 29.3* 41.0 - 53.0 % Final   ??? MCV 11/02/2020 89.3  80.0 - 100.0 fL Final   ??? MCH 11/02/2020 27.8  26.0 - 34.0 pg Final   ??? MCHC 11/02/2020 31.1  31.0 - 37.0 g/dL Final   ??? RDW 28/41/3244 19.1* 12.0 - 15.0 % Final   ??? MPV 11/02/2020 8.6  7.0 - 10.0 fL Final   ??? Platelet 11/02/2020 352  150 - 440 10*9/L Final   ??? Neutrophils % 11/02/2020 78.0  % Final   ??? Lymphocytes % 11/02/2020 12.6  % Final   ??? Monocytes % 11/02/2020 5.5  % Final   ??? Eosinophils % 11/02/2020 1.4  % Final   ??? Basophils % 11/02/2020 0.6  % Final   ??? Absolute Neutrophils 11/02/2020 9.8* 2.0 - 7.5 10*9/L Final   ??? Absolute Lymphocytes 11/02/2020 1.6  1.5 - 5.0 10*9/L Final   ??? Absolute Monocytes 11/02/2020 0.7  0.2 - 0.8 10*9/L Final   ??? Absolute Eosinophils 11/02/2020 0.2  0.0 - 0.4 10*9/L Final   ??? Absolute Basophils 11/02/2020 0.1  0.0 - 0.1 10*9/L Final   ??? Large Unstained Cells 11/02/2020 2  0 - 4 % Final   ??? Microcytosis 11/02/2020 Slight* Not Present Final   ??? Macrocytosis 11/02/2020 Slight* Not Present Final   ??? Anisocytosis 11/02/2020 Moderate* Not Present Final   ??? Hypochromasia 11/02/2020 Marked* Not Present Final   ??? Spec Gravity/POC 11/02/2020 >=1.030  1.003 - 1.030 Final   ??? PH/POC 11/02/2020 5.0  5.0 - 9.0 Final   ??? Leuk Esterase/POC 11/02/2020 Negative  Negative Final   ??? Nitrite/POC 11/02/2020 Negative  Negative Final   ??? Protein/POC 11/02/2020 Trace* Negative Final   ??? UA Glucose/POC 11/02/2020 Negative  Negative Final   ??? Ketones, POC 11/02/2020 Negative  Negative Final   ??? Bilirubin/POC  11/02/2020 Negative  Negative Final   ??? Blood/POC 11/02/2020 Negative  Negative Final   ??? Urobilinogen/POC 11/02/2020 0.2  0.2 - 1.0 mg/dL Final   ??? Smear Review Comments 11/02/2020 See Comment* Undefined Final   ??? Toxic Granulation 11/02/2020 Present* Not Present Final

## 2020-11-04 NOTE — Unmapped (Signed)
Called patient to follow up on a number of items:    1. Treatment 12/13 - patient reports that new anti-nausea regimen helped a great deal. He's had no nausea after treatment and was able to go home and eat. Discussed that tingling/cramping in hands and feet may be related to oxaliplatin and encouraged him to keep extremities warm.     2. Reviewed that patient can call [facility] each month to get ostomy supplies at [phone] Option 3, new order will need to be placed each year.     3. Reviewed the documents that patient faxed. Advised that NN would fax the two Terminal Illness statement forms to [facility] [employer] Life and Disability Claims this afternoon after obtaining physician signature [id number]. Reached out to [facility] [phone] about claim denial for period of 08/05/2020-10/01/2020. Per representative services covered include outpatient cancer treatment: radiation, chemotherapy, etc. Patient did not receive these services during this period of time. NN did fax MD progress notes from 08/21/2020 and 09/18/2020 visits to [facility] at [phone].    4. Reviewed upcoming schedule. Patient to have short break during the holidays. Next appointment - 11/28/2019 Scans + Lab + MD + Infusion    Advised patient that NN would send MyChart message with phone numbers, etc.

## 2020-11-10 NOTE — Unmapped (Signed)
I spent??5??minutes on the real-time audio and video??with the patient on the date of service. I spent an additional??10??minutes on pre- and post-visit activities on the date of service.   ??  The patient was physically located in [place] or a state in which I am permitted to provide care. The patient??and/or parent/guardian understood that s/he may incur co-pays and cost sharing, and agreed to the telemedicine visit. The visit was??reasonable and appropriate under the circumstances given the patient's presentation at the time.  ??  The patient and/or parent/guardian has been advised of the potential risks and limitations of this mode of treatment (including, but not limited to, the absence of in-person examination) and has agreed to be treated using telemedicine. The patient's/patient's family's questions regarding telemedicine have been answered.   ??  If the??visit was completed in an ambulatory setting, the patient??and/or parent/guardian has also been advised to contact their provider???s office for worsening conditions, and seek emergency medical treatment and/or call 911 if the patient deems either necessary.    Manuel White is feeling well other than mild epistaxis at times; this resolves quickly. He denies fevers, chills, mouth pain, trouble swallowing, CP, SOB, cough, abdominal pain, nausea, emesis, increased output, neuropathy. Continue with chemo; plan for scans 1-7 with MD visit. He requested to hold chemo over Christmas holiday.

## 2020-11-12 DIAGNOSIS — C189 Malignant neoplasm of colon, unspecified: Principal | ICD-10-CM

## 2020-11-12 DIAGNOSIS — C787 Secondary malignant neoplasm of liver and intrahepatic bile duct: Principal | ICD-10-CM

## 2020-11-16 NOTE — Unmapped (Signed)
Pt has a dose for his 11/30/20 cycle.  Will call back in 2 weeks.

## 2020-11-27 ENCOUNTER — Encounter: Admit: 2020-11-27 | Discharge: 2020-11-28 | Payer: PRIVATE HEALTH INSURANCE

## 2020-11-27 ENCOUNTER — Ambulatory Visit: Admit: 2020-11-27 | Discharge: 2020-11-28 | Payer: PRIVATE HEALTH INSURANCE

## 2020-11-27 ENCOUNTER — Encounter
Admit: 2020-11-27 | Discharge: 2020-11-28 | Payer: PRIVATE HEALTH INSURANCE | Attending: Internal Medicine | Primary: Internal Medicine

## 2020-11-27 DIAGNOSIS — F419 Anxiety disorder, unspecified: Principal | ICD-10-CM

## 2020-11-27 DIAGNOSIS — C787 Secondary malignant neoplasm of liver and intrahepatic bile duct: Principal | ICD-10-CM

## 2020-11-27 DIAGNOSIS — F329 Major depressive disorder, single episode, unspecified: Principal | ICD-10-CM

## 2020-11-27 DIAGNOSIS — I4891 Unspecified atrial fibrillation: Principal | ICD-10-CM

## 2020-11-27 DIAGNOSIS — C7802 Secondary malignant neoplasm of left lung: Principal | ICD-10-CM

## 2020-11-27 DIAGNOSIS — K56609 Unspecified intestinal obstruction, unspecified as to partial versus complete obstruction: Principal | ICD-10-CM

## 2020-11-27 DIAGNOSIS — R197 Diarrhea, unspecified: Principal | ICD-10-CM

## 2020-11-27 DIAGNOSIS — Z803 Family history of malignant neoplasm of breast: Principal | ICD-10-CM

## 2020-11-27 DIAGNOSIS — I10 Essential (primary) hypertension: Principal | ICD-10-CM

## 2020-11-27 DIAGNOSIS — C189 Malignant neoplasm of colon, unspecified: Principal | ICD-10-CM

## 2020-11-27 DIAGNOSIS — C7951 Secondary malignant neoplasm of bone: Principal | ICD-10-CM

## 2020-11-27 DIAGNOSIS — N400 Enlarged prostate without lower urinary tract symptoms: Principal | ICD-10-CM

## 2020-11-27 LAB — CBC W/ AUTO DIFF
BASOPHILS ABSOLUTE COUNT: 0.1 10*9/L (ref 0.0–0.1)
BASOPHILS RELATIVE PERCENT: 0.6 %
EOSINOPHILS ABSOLUTE COUNT: 0.2 10*9/L (ref 0.0–0.4)
EOSINOPHILS RELATIVE PERCENT: 2.5 %
HEMATOCRIT: 28.6 % — ABNORMAL LOW (ref 41.0–53.0)
HEMOGLOBIN: 8.7 g/dL — ABNORMAL LOW (ref 13.5–17.5)
LARGE UNSTAINED CELLS: 2 % (ref 0–4)
LYMPHOCYTES ABSOLUTE COUNT: 1.2 10*9/L — ABNORMAL LOW (ref 1.5–5.0)
LYMPHOCYTES RELATIVE PERCENT: 15.2 %
MEAN CORPUSCULAR HEMOGLOBIN CONC: 30.3 g/dL — ABNORMAL LOW (ref 31.0–37.0)
MEAN CORPUSCULAR HEMOGLOBIN: 27.9 pg (ref 26.0–34.0)
MEAN CORPUSCULAR VOLUME: 92.1 fL (ref 80.0–100.0)
MEAN PLATELET VOLUME: 8.3 fL (ref 7.0–10.0)
MONOCYTES ABSOLUTE COUNT: 0.8 10*9/L (ref 0.2–0.8)
MONOCYTES RELATIVE PERCENT: 10.5 %
NEUTROPHILS ABSOLUTE COUNT: 5.4 10*9/L (ref 2.0–7.5)
NEUTROPHILS RELATIVE PERCENT: 68.9 %
PLATELET COUNT: 360 10*9/L (ref 150–440)
RED BLOOD CELL COUNT: 3.11 10*12/L — ABNORMAL LOW (ref 4.50–5.90)
RED CELL DISTRIBUTION WIDTH: 19.9 % — ABNORMAL HIGH (ref 12.0–15.0)
WBC ADJUSTED: 7.8 10*9/L (ref 4.5–11.0)

## 2020-11-27 LAB — CEA: CARCINOEMBRYONIC ANTIGEN: 33.3 ng/mL — ABNORMAL HIGH (ref 0.0–5.0)

## 2020-11-27 LAB — BILIRUBIN, TOTAL: BILIRUBIN TOTAL: 0.2 mg/dL — ABNORMAL LOW (ref 0.3–1.2)

## 2020-11-27 LAB — CREATININE
CREATININE: 0.78 mg/dL
EGFR CKD-EPI AA MALE: >90 mL/min/{1.73_m2} (ref >=60)
EGFR CKD-EPI NON-AA MALE: >90 mL/min/{1.73_m2} (ref >=60)

## 2020-11-27 LAB — MAGNESIUM: MAGNESIUM: 1.8 mg/dL (ref 1.6–2.6)

## 2020-11-27 LAB — SLIDE REVIEW

## 2020-11-27 LAB — POTASSIUM: POTASSIUM: 3.8 mmol/L (ref 3.5–5.1)

## 2020-11-27 MED ORDER — POLYETHYLENE GLYCOL 3350 17 GRAM ORAL POWDER PACKET
PACK | Freq: Every day | ORAL | 6 refills | 30 days | Status: CP
Start: 2020-11-27 — End: ?

## 2020-11-27 MED ORDER — DEXAMETHASONE 4 MG TABLET
ORAL_TABLET | 5 refills | 0 days | Status: CP
Start: 2020-11-27 — End: ?

## 2020-11-27 MED ADMIN — oxaliplatin (ELOXATIN) 139.4 mg in dextrose 5 % 250 mL chemo infusion: 85 mg/m2 | INTRAVENOUS | @ 21:00:00 | Stop: 2020-11-27

## 2020-11-27 MED ADMIN — fluorouracil (ADRUCIL) 3,950 mg in sodium chloride (NS) 0.9 % 46-hr infusion CADD: 2400 mg/m2 | INTRAVENOUS | @ 23:00:00 | Stop: 2020-11-28

## 2020-11-27 MED ADMIN — leucovorin 328 mg in dextrose 5 % 50 mL IVPB: 200 mg/m2 | INTRAVENOUS | @ 21:00:00 | Stop: 2020-11-27

## 2020-11-27 MED ADMIN — fosaprepitant (EMEND) 150 mg in sodium chloride (NS) 0.9 % 100 mL IVPB: 150 mg | INTRAVENOUS | @ 19:00:00 | Stop: 2020-11-27

## 2020-11-27 MED ADMIN — iohexoL (OMNIPAQUE) 350 mg iodine/mL solution 100 mL: 100 mL | INTRAVENOUS | @ 14:00:00 | Stop: 2020-11-27

## 2020-11-27 MED ADMIN — atropine injection: .4 mg | INTRAVENOUS | @ 20:00:00 | Stop: 2020-11-28

## 2020-11-27 MED ADMIN — irinotecan (CAMPTOSAR) 270.6 mg in dextrose 5 % 500 mL IVPB: 165 mg/m2 | INTRAVENOUS | @ 20:00:00 | Stop: 2020-11-27

## 2020-11-27 MED ADMIN — dextrose 5 % infusion: 100 mL/h | INTRAVENOUS | @ 19:00:00 | Stop: 2020-11-27

## 2020-11-27 MED ADMIN — sodium chloride (NS) 0.9 % infusion: 100 mL/h | INTRAVENOUS | @ 23:00:00 | Stop: 2020-11-28

## 2020-11-27 MED ADMIN — bevacizumab-bvzr (ZIRABEV) 267.5 mg in sodium chloride (NS) 0.9 % 100 mL IVPB: 5 mg/kg | INTRAVENOUS | @ 23:00:00 | Stop: 2020-11-27

## 2020-11-27 MED ADMIN — dexAMETHasone (DECADRON) tablet 12 mg: 12 mg | ORAL | @ 19:00:00 | Stop: 2020-11-27

## 2020-11-27 MED ADMIN — ondansetron (ZOFRAN) tablet 24 mg: 24 mg | ORAL | @ 19:00:00 | Stop: 2020-11-27

## 2020-11-27 NOTE — Unmapped (Signed)
Labs found to be within parameters for treatment today. Request for drug sent to pharmacy.

## 2020-11-27 NOTE — Unmapped (Unsigned)
PT in lab for port access and lab draws, 20 3/4 gauge power port needle used BC PT has a CT scan today, blood return brisk, saline locked, CHG gel dressing applied as PT will be taking pump home. Labs sent for analysis.

## 2020-11-27 NOTE — Unmapped (Signed)
Service:  Hematology and Oncology  Patient Name: Manuel White  Patient Age: 60 y.o.  Encounter Date: 11/27/2020    PRIMARY CARE PHYSICIAN  Hadley Pen, MD  [address] [facility] [place]  [place] [address]    CONSULTING PHYSICIANS  Lesia Sago, Md  [address]  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 Large bowel obstruction (rectosigmoid obstructing mass), s/p loop diverting ostomy 08-03-20  3. Weight loss  4. A fib with one episode SVT inpatient    - he will need colonoscopy at some point in future, as he has not had one to date and we cannot rule out synchronous lesions; plan post C8  - proceed with C6 today; social work for Energy manager  - abdominal pain: norco refilled  - nausea: prescribed zofran in addition to home phenergan  - constipation: colace and senaa  - COVID -19 risk:  Discussed ideal timing for booster  - decreased bilateral hearing: audiology referral        _____________________________________________________________________   ONCOLOGY HISTORY  Diagnosed age 15 (Sept 2021) with metastatic colorectal cancer.  PMH is notable for kidney stones and A fib.  Reports intermittent diarrhea and abdominal pain since March 2021. CT 07-29-20 with colon mass.  He presented to [facility] the weekend prior to planned palliative colonic stent for large bowel obstruction, and underwent flex sig with diverting loop colonoscopy 08-03-20.  Biopsy consistent with moderately differentiated adenocarcinoma (MSS). Staging CT ([facility]) notable for liver, lung and possible L iliac met.    INTERVAL HISTORY:  Accompanied by his wife. Feels well since last chemotherapy except for some increased constipation/harder stools at times. He received a notice from insurance that TEMPUS was not approved. Reduced hearing bilaterally.  ??  A 10 point ROS was performed and negative except as noted above in the HPI.    _____________________________________________  OTHER PAST MEDICAL HISTORY / CURRENT PROBLEM LIST    Afib/SVT  HTN  MDD/Anxiety  BPH    Past Medical History:   Diagnosis Date   ??? BPH (benign prostatic hyperplasia)    ??? Colon cancer (CMS-HCC)    ??? Hypertension       _____________________________________________________________________  ALLERGIES    Patient has no known allergies.     _____________________________________________________________________  CURRENT MEDICATIONS    I am having [name] maintain his citalopram, docusate sodium, LORazepam, metoprolol succinate, tamsulosin, fluticasone propionate, oxyCODONE, acetaminophen, multivitamin, loperamide, (heparin, porcine (PF)), sodium chloride, pegfilgrastim-bmez, dexAMETHasone, HYDROcodone-acetaminophen, SYSTANE BALANCE, ondansetron, promethazine, and loratadine.     _____________________________________________________________________  HABITS    SOCIAL HISTORY  Social History     Socioeconomic History   ??? Marital status: Married     Spouse name: Not on file   ??? Number of children: Not on file   ??? Years of education: Not on file   ??? Highest education level: Not on file   Occupational  History   ??? Not on file   Tobacco Use   ??? Smoking status: Never Smoker   ??? Smokeless tobacco: Never Used   Vaping Use   ??? Vaping Use: Never used   Substance and Sexual Activity   ??? Alcohol use: Never   ??? Drug use: Never   ??? Sexual activity: Not Currently   Other Topics Concern   ??? Not on file   Social History Narrative   ??? Not on file     Social Determinants of Health     Financial Resource Strain: Low Risk    ??? Difficulty of Paying Living Expenses: Not hard at all   Food Insecurity: No Food Insecurity   ??? Worried About Programme researcher, broadcasting/film/video in the Last Year: Never true   ??? Ran Out of Food in the Last Year: Never true   Transportation Needs: No Transportation Needs   ??? Lack of Transportation (Medical): No   ??? Lack of Transportation (Non-Medical): No   Physical Activity: Not on file   Stress: Not on file   Social Connections: Not on file         FAMILY HISTORY  Mother- Uterine, Breast Ca, deceased     _____________________________________________________________________  PHYSICAL EXAM    VITAL SIGNS: BP 127/82  - Pulse 74  - Temp 36.6 ??C (97.9 ??F) (Temporal)  - Resp 16  - Wt 57.6 kg (126 lb 14.4 oz)  - SpO2 98%  - BMI 17.70 kg/m??   Gen: Thin man, sitting comfortably on couch, NAD  HEENT: PERRL, non-icteric sclera, no cervical or supraclavicular LAD  CV: RRR, no M/R/G, no LE edema  Pulm: CTAB  AB: +BS, soft, ND, ND, ostomy in place with normal output  Skin: no rash, lesions  Psych: normal affect,    _____________________________________________________________________  LABS    Lab on 11/27/2020   Component Date Value   ??? Creatinine 11/27/2020 0.78    ??? EGFR CKD-EPI Non-African* 11/27/2020 >90    ??? EGFR CKD-EPI African Ame* 11/27/2020 >90    ??? Total Bilirubin 11/27/2020 0.2*   ??? Potassium 11/27/2020 3.8    ??? Magnesium 11/27/2020 1.8    ??? CEA 11/27/2020 33.3*   ??? WBC 11/27/2020 7.8    ??? RBC 11/27/2020 3.11*   ??? HGB 11/27/2020 8.7*   ??? HCT 11/27/2020 28.6*   ??? MCV 11/27/2020 92.1    ??? MCH 11/27/2020 27.9    ??? MCHC 11/27/2020 30.3*   ??? RDW 11/27/2020 19.9*   ??? MPV 11/27/2020 8.3    ??? Platelet 11/27/2020 360    ??? Neutrophils % 11/27/2020 68.9    ??? Lymphocytes % 11/27/2020 15.2    ??? Monocytes % 11/27/2020 10.5    ??? Eosinophils % 11/27/2020 2.5    ??? Basophils % 11/27/2020 0.6    ??? Absolute Neutrophils 11/27/2020 5.4    ??? Absolute Lymphocytes 11/27/2020 1.2*   ??? Absolute Monocytes 11/27/2020 0.8    ??? Absolute Eosinophils 11/27/2020 0.2    ??? Absolute Basophils 11/27/2020 0.1    ??? Large Unstained Cells 11/27/2020 2    ??? Macrocytosis 11/27/2020 Moderate*   ??? Anisocytosis 11/27/2020 Moderate*   ??? Hypochromasia 11/27/2020 Marked*   ??? Smear Review Comments 11/27/2020 See Comment*   ??? Spec Gravity/POC 11/27/2020 >=1.030    ??? PH/POC 11/27/2020 5.5    ??? Leuk Esterase/POC 11/27/2020 Negative    ??? Nitrite/POC 11/27/2020 Negative    ??? Protein/POC 11/27/2020 Negative    ??? UA Glucose/POC  11/27/2020 Negative    ??? Ketones, POC 11/27/2020 Negative    ??? Bilirubin/POC 11/27/2020 Negative    ??? Blood/POC 11/27/2020 Negative    ??? Urobilinogen/POC 11/27/2020 0.2        RADIOLOGY / OTHER DIAGNOSTIC STUDIES  Flexible sigmoidoscopy 08-03-20:  Findings:       Hemorrhoids were found on perianal exam.       A fungating and completely obstructing large mass was found at 16 cm        proximal to the anus. No bleeding was present. Biopsies were taken with        a cold forceps for histology.                                                                                   Impression:            - Hemorrhoids found on perianal exam.                         - Likely malignant completely obstructing tumor at 16                          cm proximal to the anus. Biopsied.    PATHOLOGY  08-03-20:  Addendum   The purpose of this addendum is to report the results of immunohistochemical stains for mismatch repair proteins.    ??  Immunohistochemical stains for MLH1, MSH2, MSH6 and PMS2 were performed on block A1.  The tumor cells demonstrate appropriate nuclear expression for all four markers.  This is the normal phenotype and does NOT support a diagnosis of microsatellite instability/hereditary non-polyposis colorectal cancer (HNPCC).  Separate molecular testing for microsatellite instability markers will be performed by the [employer] (phone # [phone]) and reported separately.    ??  This result may have implications for therapy selection, as MSS/mismatch repair proficient tumors are less likely to respond to immune modulating agents like Pembrolizumab (1).  ??  1) Le DT, et al.?? [employer] J Med. 2015 Jun 25;372(26):2509-20. PMID: 09811914  ??  The original diagnoses are unchanged.  ??  Selected tissue block for possible future molecular pathology studies:  A1  ??  ??   Addendum electronically signed by Lyla Glassing, MD on 08/05/2020 at 1621   Final Diagnosis   A: Colon, rectosigmoid tumor, biopsy  - Invasive moderately differentiated adenocarcinoma associated with scant fragments of a tubulovillous adenoma with high-grade dysplasia  - No lymphovascular space invasion identified  - See comment         Imaging:  CT chest 11-23-20:  1. Numerous pulmonary nodules, involving all 5 lobes, most of which appear cavitary. Those nodules imaged on the previous CT abdomen demonstrate interval decreased size and increased cavitation since previous.  2. Mildly dilated ascending thoracic aorta.    CT AP 11-23-20:  Focal masslike thickening of the sigmoid colon consistent with known adenocarcinoma is mildly decreased in size.  ??  Interval left lower quadrant diverting loop sigmoid colostomy. Large colonic stool burden with no evidence of obstruction.  ??  Numerous hepatic metastasis are overall  decreased in size and enhancement suggestive of treatment response.  ??  Stable partially calcified retroperitoneal lymphadenopathy.  ??  Unchanged mixed lytic and sclerotic left iliac lesion.  ??  No evidence of new metastatic disease within the abdomen/pelvis.  ??    Bone scan 09-18-20:  Slight increased uptake in the medial aspect of the of the left iliac bone corresponding to the lucency surrounded by sclerotic change seen on CT dated 07/29/20.

## 2020-11-27 NOTE — Unmapped (Signed)
Called [facility] at [facility] about referral. Schedulers are going to call patient to get him set up with appointment.

## 2020-11-27 NOTE — Unmapped (Signed)
Outpatient Oncology Social Work   Initial Clinical Assessment        Referral:   Nonie Hoyer, Nurse Navigator  Pt has questions about SSD and insurance.    Oncology Treatment Status:   Pt is a 60yo male with a diagnosis of Stage IVb metastatic colon cancer involving bone, liver, lungs, and para-aortic LAD.    Financial/Insurance:   Pt has BCBS through his employer, but states that he is not sure when his insurance will run out.   Pt is currently employed with [employer] and states that after speaking with his provider, he plan on stopping work due to his illness. SW encouraged Pt to apply for SSD. Pt reports that his current income is around $2000/mo.    Psychosocial/Coping Issues:   SW met with Pt and wife Inetta Fermo in the infusion clinic. Pt states that he is out on short term disability and still has sick time he can use. He states that he believes his time off will end in March. Pt met with a Physiological scientist and was provided with a Curator. SW encouraged Pt to apply. SW also explained that if he is no longer eligible for insurance through his employer, he might be eligible for extending coverage through COBRA. SW asked Pt to contact SW if he receives a letter regarding COBRA as Adrian can provide premium coverage through COBRAcare. Pt reported understanding.     Pt is interested in assistance with transportation and was informed about the gas card program through the Cancer Patient Assistance Fund (CPAF). Currently, his wife drives him to appts, reporting the drive is a little over an hour one way. Pt gave verbal consent for SW to submit an application to [employer]PA[employer] for gas cards. The application was completed and provided to [employer] for approval.    SW also explained that CPAF is able to assist with household bills such as water, gas, electric and rent/mortgage. Pt will send SW a copy of his bill for SW to submit for assistance.    Follow-Up Plan:   Pt has this SW's contact information and will reach out for assistance as needed.      Annice Pih, LCSW  Oncology Outpatient Social Worker  [phone]

## 2020-11-27 NOTE — Unmapped (Signed)
Spoke with patient and wife today. Patient states he wants to apply for disability. Will send my chart message with link. NN will request social work follow up with patient about disability application process. Dexamethasone prescription refilled per Dr. Tye Maryland request. Patient requesting letter stating that he cannot return to work. NN will provide to patient while he is in infusion.     Dr. Tye Maryland has placed audiology referral. NN to follow up with patient about which location would be most ideal.

## 2020-11-28 NOTE — Unmapped (Addendum)
Results for Manuel White, Manuel White (MRN [id number]) as of 11/27/2020 17:02   Ref. Range 11/27/2020 08:43 11/27/2020 09:13 11/27/2020 09:22   WBC Latest Ref Range: 4.5 - 11.0 10*9/L 7.8     RBC Latest Ref Range: 4.50 - 5.90 10*12/L 3.11 (L)     HGB Latest Ref Range: 13.5 - 17.5 g/dL 8.7 (L)     HCT Latest Ref Range: 41.0 - 53.0 % 28.6 (L)     MCV Latest Ref Range: 80.0 - 100.0 fL 92.1     MCH Latest Ref Range: 26.0 - 34.0 pg 27.9     MCHC Latest Ref Range: 31.0 - 37.0 g/dL 63.8 (L)     RDW Latest Ref Range: 12.0 - 15.0 % 19.9 (H)     MPV Latest Ref Range: 7.0 - 10.0 fL 8.3     Platelet Latest Ref Range: 150 - 440 10*9/L 360     Neutrophils % Latest Units: % 68.9     Lymphocytes % Latest Units: % 15.2     Monocytes % Latest Units: % 10.5     Eosinophils % Latest Units: % 2.5     Basophils % Latest Units: % 0.6     Absolute Neutrophils Latest Ref Range: 2.0 - 7.5 10*9/L 5.4     Absolute Lymphocytes Latest Ref Range: 1.5 - 5.0 10*9/L 1.2 (L)     Absolute Monocytes  Latest Ref Range: 0.2 - 0.8 10*9/L 0.8     Absolute Eosinophils Latest Ref Range: 0.0 - 0.4 10*9/L 0.2     Absolute Basophils  Latest Ref Range: 0.0 - 0.1 10*9/L 0.1     Macrocytosis Latest Ref Range: Not Present  Moderate (A)     Anisocytosis Latest Ref Range: Not Present  Moderate (A)     Hypochromasia Latest Ref Range: Not Present  Marked (A)     Smear Review Latest Ref Range: Undefined  See Comment (A)     Large Unstained Cells Latest Ref Range: 0 - 4 % 2     Potassium Latest Ref Range: 3.5 - 5.1 mmol/L 3.8     Creatinine Latest Ref Range: 0.60 - 1.10 mg/dL 7.56     EGFR CKD-EPI African American, Male Latest Ref Range: >=60 mL/min/1.33m2 >90     EGFR CKD-EPI Non-African American, Male Latest Ref Range: >=60 mL/min/1.24m2 >90     Magnesium Latest Ref Range: 1.6 - 2.6 mg/dL 1.8     Total Bilirubin Latest Ref Range: 0.3 - 1.2 mg/dL 0.2 (L)     CEA Latest Ref Range: 0.0 - 5.0 ng/mL 33.3 (H)     Spec Gravity/POC Latest Ref Range: 1.003 - 1.030   >=1.030    PH/POC Latest Ref Range: 5.0 - 9.0   5.5    Leuk Esterase/POC Latest Ref Range: Negative   Negative    Nitrite/POC Latest Ref Range: Negative   Negative    Protein/POC Latest Ref Range: Negative   Negative    UA Glucose/POC Latest Ref Range: Negative   Negative    Ketones, POC Latest Ref Range: Negative   Negative    Bilirubin/POC Latest Ref Range: Negative   Negative    Blood/POC Latest Ref Range: Negative   Negative    Urobilinogen/POC Latest Ref Range: 0.2 - 1.0 mg/dL  0.2    CT ABDOMEN PELVIS W CONTRAST Unknown   Rpt   CT CHEST W CONTRAST Unknown   Rpt

## 2020-11-28 NOTE — Unmapped (Signed)
1355: Pt here for scheduled infusion.     1830: Pt tolerated treatment/infusion W/O difficulty. Port left accessed per protocol for home infusion.  Pt left infusion center ambulatory. NAD, no questions nor complaints voiced at D/C. Pt aware of follow up.

## 2020-12-10 DIAGNOSIS — C787 Secondary malignant neoplasm of liver and intrahepatic bile duct: Principal | ICD-10-CM

## 2020-12-10 DIAGNOSIS — C189 Malignant neoplasm of colon, unspecified: Principal | ICD-10-CM

## 2020-12-10 MED FILL — ZIEXTENZO 6 MG/0.6 ML SUBCUTANEOUS SYRINGE: 28 days supply | Qty: 1.2 | Fill #2

## 2020-12-10 NOTE — Unmapped (Signed)
Contacted patient, [facility], and [facility] to confirm Ziextenzo delivery plans for upcoming chemotherapy cycle (planned start on 12/11/20). Of note, Ziextenzo prescription to be delivered via courier to [facility] and patient will pick up on 12/11/20 given that he will be on site for his scheduled infusion appointment. Patient confirmed understanding and was appreciative of the call.    Care coordination: 15 minutes
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
## 2020-12-10 NOTE — Unmapped (Signed)
Re-faxed Terminal Illness Physician Forms to [employer] at [phone]. Originally faxed on 11-04-2020. Called [phone] to confirm fax was received.    Spent > 60 min on hold or being transferred by [facility], looking into claim abut Tempus testing. NN will attempt again tomorrow.

## 2020-12-10 NOTE — Unmapped (Signed)
[facility] Refill Coordination Note    Specialty Medication(s) to be Shipped:   Hematology/Oncology: Ziextenzo 6 mg/ 0.6 mL    Other medication(s) to be shipped: No additional medications requested for fill at this time     Jerret Mcbane, DOB: Jul 05, 1961  Phone: [phone] (home)       All above HIPAA information was verified with patient.     Was a Nurse, learning disability used for this call? No    Completed refill call assessment today to schedule patient's medication shipment from the [facility] [phone]).       Specialty medication(s) and dose(s) confirmed: Regimen is correct and unchanged.   Changes to medications: Dolores reports no changes at this time.  Changes to insurance: No  Questions for the pharmacist: No    Confirmed patient received Welcome Packet with first shipment. The patient will receive a drug information handout for each medication shipped and additional FDA Medication Guides as required.       DISEASE/MEDICATION-SPECIFIC INFORMATION        For patients on injectable medications: Patient currently has 0 doses left.  Next injection is scheduled for 12/14/20.    SPECIALTY MEDICATION ADHERENCE     Medication Adherence    Patient reported X missed doses in the last month: 0  Specialty Medication: Ziextenzo 6 mg/ 0.6 mL  Patient is on additional specialty medications: No  Informant: patient                Ziextenzo 6 mg/ 0.19mL: 0 days of medicine on hand         SHIPPING     Shipping address confirmed in Epic.     Delivery Scheduled: Yes, Expected medication delivery date: 12/10/20.     Medication will be delivered via Same Day Courier to the temporary address in Epic [place].    [name]   [facility] Specialty Technician

## 2020-12-11 NOTE — Unmapped (Signed)
Called patient to see if he was coming in for his infusion today. He said he received a message last night saying not to come that he would be rescheduled. Patient said he could only come on Friday's and asked to be moved to next week. All future appointments have been pushed out. He also wants to know if his injection will be sent to him or will he need to come in and pick it up at the pharmacy.     Manuel White

## 2020-12-11 NOTE — Unmapped (Signed)
Called and spoke with patient. Patient was scheduled for infusion today which was originally delayed and then canceled late in the evening last night. Infusion rescheduled for next Friday, 1-28. Discussed with patient that his schedule of appointments will need to be changed. He is in agreement.    Patient did receive the letter he requested letter stating that he cannot return to work at this time.    NN also provided updates about insurance/coverage questions:  - Paperwork provided to NN from patient from BCBS is a statement telling him that they paid [employer] for services. NN advised patient to bring in any bills received for these services  - [employer]: NN discuss with patient that this policy only covers treatment including: chemotherapy, radiation, surgery, blood products. If he has received any additional paperwork from [facility] that he has questions about, he can bring to his next visit. Patient asked about receiving set amount of money for cancer diagnosis. Encouraged him to reach out to [facility] he spoke with about this.  - [employer]: Advised patient that NN reached out to company yesterday who stated that faxes were not received when sent on 11/04/2020. Documents re-faxed. NN called to confirm that fax was received.    Patient expressed understanding of all and expressed appreciation for the call.

## 2020-12-12 NOTE — Unmapped (Signed)
Called and spoke to patient's wife. Verified upcoming appointments/schedule.

## 2020-12-18 ENCOUNTER — Other Ambulatory Visit: Admit: 2020-12-18 | Discharge: 2020-12-19 | Payer: PRIVATE HEALTH INSURANCE

## 2020-12-18 ENCOUNTER — Ambulatory Visit: Admit: 2020-12-18 | Discharge: 2020-12-19 | Payer: PRIVATE HEALTH INSURANCE

## 2020-12-18 DIAGNOSIS — Z5111 Encounter for antineoplastic chemotherapy: Principal | ICD-10-CM

## 2020-12-18 DIAGNOSIS — C189 Malignant neoplasm of colon, unspecified: Principal | ICD-10-CM

## 2020-12-18 DIAGNOSIS — C787 Secondary malignant neoplasm of liver and intrahepatic bile duct: Principal | ICD-10-CM

## 2020-12-18 DIAGNOSIS — Z5112 Encounter for antineoplastic immunotherapy: Principal | ICD-10-CM

## 2020-12-18 LAB — CBC W/ AUTO DIFF
BASOPHILS ABSOLUTE COUNT: 0 10*9/L (ref 0.0–0.1)
BASOPHILS RELATIVE PERCENT: 0.8 %
EOSINOPHILS ABSOLUTE COUNT: 0.4 10*9/L (ref 0.0–0.4)
EOSINOPHILS RELATIVE PERCENT: 8.3 %
HEMATOCRIT: 31.7 % — ABNORMAL LOW (ref 41.0–53.0)
HEMOGLOBIN: 9.8 g/dL — ABNORMAL LOW (ref 13.5–17.5)
LARGE UNSTAINED CELLS: 3 % (ref 0–4)
LYMPHOCYTES ABSOLUTE COUNT: 1.1 10*9/L — ABNORMAL LOW (ref 1.5–5.0)
LYMPHOCYTES RELATIVE PERCENT: 20.3 %
MEAN CORPUSCULAR HEMOGLOBIN CONC: 31 g/dL (ref 31.0–37.0)
MEAN CORPUSCULAR HEMOGLOBIN: 28.4 pg (ref 26.0–34.0)
MEAN CORPUSCULAR VOLUME: 91.8 fL (ref 80.0–100.0)
MEAN PLATELET VOLUME: 8.3 fL (ref 7.0–10.0)
MONOCYTES ABSOLUTE COUNT: 0.5 10*9/L (ref 0.2–0.8)
MONOCYTES RELATIVE PERCENT: 9 %
NEUTROPHILS ABSOLUTE COUNT: 3 10*9/L (ref 2.0–7.5)
NEUTROPHILS RELATIVE PERCENT: 58.5 %
PLATELET COUNT: 329 10*9/L (ref 150–440)
RED BLOOD CELL COUNT: 3.45 10*12/L — ABNORMAL LOW (ref 4.50–5.90)
RED CELL DISTRIBUTION WIDTH: 20.1 % — ABNORMAL HIGH (ref 12.0–15.0)
WBC ADJUSTED: 5.2 10*9/L (ref 4.5–11.0)

## 2020-12-18 LAB — COMPREHENSIVE METABOLIC PANEL
ALBUMIN: 3 g/dL — ABNORMAL LOW (ref 3.4–5.0)
ALKALINE PHOSPHATASE: 178 U/L — ABNORMAL HIGH (ref 46–116)
ALT (SGPT): 22 U/L (ref 10–49)
ANION GAP: 2 mmol/L — ABNORMAL LOW (ref 5–14)
AST (SGOT): 30 U/L (ref <=34)
BILIRUBIN TOTAL: 0.2 mg/dL — ABNORMAL LOW (ref 0.3–1.2)
BLOOD UREA NITROGEN: 12 mg/dL (ref 9–23)
BUN / CREAT RATIO: 15
CALCIUM: 8.6 mg/dL — ABNORMAL LOW (ref 8.7–10.4)
CHLORIDE: 109 mmol/L — ABNORMAL HIGH (ref 98–107)
CO2: 30 mmol/L (ref 20.0–31.0)
CREATININE: 0.79 mg/dL
EGFR CKD-EPI AA MALE: >90 mL/min/{1.73_m2} (ref >=60)
EGFR CKD-EPI NON-AA MALE: >90 mL/min/{1.73_m2} (ref >=60)
GLUCOSE RANDOM: 109 mg/dL (ref 70–179)
POTASSIUM: 3.5 mmol/L (ref 3.5–5.1)
PROTEIN TOTAL: 5.6 g/dL — ABNORMAL LOW (ref 5.7–8.2)
SODIUM: 141 mmol/L (ref 135–145)

## 2020-12-18 LAB — MAGNESIUM: MAGNESIUM: 1.6 mg/dL (ref 1.6–2.6)

## 2020-12-18 LAB — SLIDE REVIEW

## 2020-12-18 LAB — CEA: CARCINOEMBRYONIC ANTIGEN: 30.9 ng/mL — ABNORMAL HIGH (ref 0.0–5.0)

## 2020-12-18 MED ADMIN — irinotecan (CAMPTOSAR) 270.6 mg in dextrose 5 % 500 mL IVPB: 165 mg/m2 | INTRAVENOUS | @ 15:00:00 | Stop: 2020-12-18

## 2020-12-18 MED ADMIN — dexAMETHasone (DECADRON) tablet 12 mg: 12 mg | ORAL | @ 14:00:00 | Stop: 2020-12-18

## 2020-12-18 MED ADMIN — ondansetron (ZOFRAN) tablet 24 mg: 24 mg | ORAL | @ 14:00:00 | Stop: 2020-12-18

## 2020-12-18 MED ADMIN — leucovorin 328 mg in dextrose 5 % 50 mL IVPB: 200 mg/m2 | INTRAVENOUS | @ 16:00:00 | Stop: 2020-12-18

## 2020-12-18 MED ADMIN — sodium chloride (NS) 0.9 % infusion: 100 mL/h | INTRAVENOUS | @ 18:00:00 | Stop: 2020-12-19

## 2020-12-18 MED ADMIN — atropine injection: .4 mg | INTRAVENOUS | @ 15:00:00 | Stop: 2020-12-19

## 2020-12-18 MED ADMIN — oxaliplatin (ELOXATIN) 139.4 mg in dextrose 5 % 250 mL chemo infusion: 85 mg/m2 | INTRAVENOUS | @ 16:00:00 | Stop: 2020-12-18

## 2020-12-18 MED ADMIN — dextrose 5 % infusion: 100 mL/h | INTRAVENOUS | @ 14:00:00 | Stop: 2020-12-18

## 2020-12-18 MED ADMIN — bevacizumab-bvzr (ZIRABEV) 297.5 mg in sodium chloride (NS) 0.9 % 100 mL IVPB: 5 mg/kg | INTRAVENOUS | @ 18:00:00 | Stop: 2020-12-18

## 2020-12-18 MED ADMIN — fluorouracil (ADRUCIL) 3,950 mg in sodium chloride (NS) 0.9 % 46-hr infusion CADD: 2400 mg/m2 | INTRAVENOUS | @ 19:00:00 | Stop: 2020-12-19

## 2020-12-18 MED ADMIN — fosaprepitant (EMEND) 150 mg in sodium chloride (NS) 0.9 % 100 mL IVPB: 150 mg | INTRAVENOUS | @ 14:00:00 | Stop: 2020-12-18

## 2020-12-18 NOTE — Unmapped (Signed)
Patient received FOLFIRINOX and bevacizumab infusions according to treatment plan protocol His port was assessed, flushed and connected to a CADD to complete home infusion. He declined an AVS and was discharged.

## 2020-12-18 NOTE — Unmapped (Signed)
Outpatient Oncology Social Work  Follow Up       Reason for Contact:   Financial Assistance    Psychosocial & Case Management Update:  SW met with Pt and his wife in infusion. Pt reports that he has been working on gathering documents for his employer. He states that he is currently using sick time, which will end in March and after that he states that he has accumulated enough PTO to use through the summer. He reports that after using his PTO, he plans on taking short-term disability. Pt's current insurance is through his employer and according to the chart, the application process for [employer] was started on 12/17/20.    Pt reported having some financial concerns due to no longer being able to work and states that he does plan on applying for SSD. SW provided information regarding Scientific laboratory technician and the [employer]. Pt's wife states that she will get copies of the bills to SW for SW to submit for financial assistance.     Pt and wife confirm that they did receive the gas cards in the mail and were appreciative.     Follow-Up Plan:   Pt has this SW's contact information and will reach out for assistance as needed.      Annice Pih, LCSW  Oncology Outpatient Social Worker  [phone]

## 2020-12-24 NOTE — Unmapped (Signed)
[facility] Refill Coordination Note    Specialty Medication(s) to be Shipped:   Hematology/Oncology: Ziextenzo 6 mg/ 0.6 mL    Other medication(s) to be shipped: No additional medications requested for fill at this time     Manuel White, DOB: 06-12-1961  Phone: [phone] (home)       All above HIPAA information was verified with patient.     Was a Nurse, learning disability used for this call? No    Completed refill call assessment today to schedule patient's medication shipment from the [facility] [phone]).       Specialty medication(s) and dose(s) confirmed: Regimen is correct and unchanged.   Changes to medications: [name] reports no changes at this time.  Changes to insurance: No  Questions for the pharmacist: No    Confirmed patient received Welcome Packet with first shipment. The patient will receive a drug information handout for each medication shipped and additional FDA Medication Guides as required.       DISEASE/MEDICATION-SPECIFIC INFORMATION        For patients on injectable medications: Patient currently has 1 doses left.  Next injection is scheduled for 01/01/21.    SPECIALTY MEDICATION ADHERENCE     Medication Adherence    Patient reported X missed doses in the last month: 0  Specialty Medication: Ziextenzo 6 mg/ 0.6 mL  Patient is on additional specialty medications: No  Informant: patient                Ziextenzo 6 mg/ 0.6 mL: 1 days of medicine on hand         SHIPPING     Shipping address confirmed in Epic.     Delivery Scheduled: Yes, Expected medication delivery date: 01/12/21.     Medication will be delivered via UPS to the prescription address in Epic [place].    [name]   [facility] Specialty Technician

## 2021-01-01 ENCOUNTER — Ambulatory Visit
Admit: 2021-01-01 | Discharge: 2021-01-02 | Payer: PRIVATE HEALTH INSURANCE | Attending: Internal Medicine | Primary: Internal Medicine

## 2021-01-01 ENCOUNTER — Encounter: Admit: 2021-01-01 | Discharge: 2021-01-02 | Payer: PRIVATE HEALTH INSURANCE

## 2021-01-01 ENCOUNTER — Ambulatory Visit: Admit: 2021-01-01 | Discharge: 2021-01-02 | Payer: PRIVATE HEALTH INSURANCE

## 2021-01-01 DIAGNOSIS — I4891 Unspecified atrial fibrillation: Principal | ICD-10-CM

## 2021-01-01 DIAGNOSIS — C7800 Secondary malignant neoplasm of unspecified lung: Principal | ICD-10-CM

## 2021-01-01 DIAGNOSIS — C787 Secondary malignant neoplasm of liver and intrahepatic bile duct: Principal | ICD-10-CM

## 2021-01-01 DIAGNOSIS — Z5111 Encounter for antineoplastic chemotherapy: Principal | ICD-10-CM

## 2021-01-01 DIAGNOSIS — I10 Essential (primary) hypertension: Principal | ICD-10-CM

## 2021-01-01 DIAGNOSIS — Z5112 Encounter for antineoplastic immunotherapy: Principal | ICD-10-CM

## 2021-01-01 DIAGNOSIS — C189 Malignant neoplasm of colon, unspecified: Principal | ICD-10-CM

## 2021-01-01 DIAGNOSIS — G629 Polyneuropathy, unspecified: Principal | ICD-10-CM

## 2021-01-01 DIAGNOSIS — D649 Anemia, unspecified: Principal | ICD-10-CM

## 2021-01-01 LAB — SLIDE REVIEW

## 2021-01-01 LAB — CBC W/ AUTO DIFF
BASOPHILS ABSOLUTE COUNT: 0 10*9/L (ref 0.0–0.1)
BASOPHILS RELATIVE PERCENT: 0.3 %
EOSINOPHILS ABSOLUTE COUNT: 0.3 10*9/L (ref 0.0–0.4)
EOSINOPHILS RELATIVE PERCENT: 3.4 %
HEMATOCRIT: 32 % — ABNORMAL LOW (ref 41.0–53.0)
HEMOGLOBIN: 9.9 g/dL — ABNORMAL LOW (ref 13.5–17.5)
LARGE UNSTAINED CELLS: 2 % (ref 0–4)
LYMPHOCYTES ABSOLUTE COUNT: 1.3 10*9/L — ABNORMAL LOW (ref 1.5–5.0)
LYMPHOCYTES RELATIVE PERCENT: 14.5 %
MEAN CORPUSCULAR HEMOGLOBIN CONC: 30.9 g/dL — ABNORMAL LOW (ref 31.0–37.0)
MEAN CORPUSCULAR HEMOGLOBIN: 28.4 pg (ref 26.0–34.0)
MEAN CORPUSCULAR VOLUME: 91.9 fL (ref 80.0–100.0)
MEAN PLATELET VOLUME: 8.6 fL (ref 7.0–10.0)
MONOCYTES ABSOLUTE COUNT: 0.4 10*9/L (ref 0.2–0.8)
MONOCYTES RELATIVE PERCENT: 4.8 %
NEUTROPHILS ABSOLUTE COUNT: 6.9 10*9/L (ref 2.0–7.5)
NEUTROPHILS RELATIVE PERCENT: 75.1 %
PLATELET COUNT: 239 10*9/L (ref 150–440)
RED BLOOD CELL COUNT: 3.48 10*12/L — ABNORMAL LOW (ref 4.50–5.90)
RED CELL DISTRIBUTION WIDTH: 19.4 % — ABNORMAL HIGH (ref 12.0–15.0)
WBC ADJUSTED: 9.2 10*9/L (ref 4.5–11.0)

## 2021-01-01 LAB — COMPREHENSIVE METABOLIC PANEL
ALBUMIN: 3 g/dL — ABNORMAL LOW (ref 3.4–5.0)
ALKALINE PHOSPHATASE: 266 U/L — ABNORMAL HIGH (ref 46–116)
ALT (SGPT): 31 U/L (ref 10–49)
ANION GAP: 9 mmol/L (ref 5–14)
AST (SGOT): 32 U/L (ref <=34)
BILIRUBIN TOTAL: <0.2 mg/dL — ABNORMAL LOW (ref 0.3–1.2)
BLOOD UREA NITROGEN: 11 mg/dL (ref 9–23)
BUN / CREAT RATIO: 13
CALCIUM: 8.4 mg/dL — ABNORMAL LOW (ref 8.7–10.4)
CHLORIDE: 106 mmol/L (ref 98–107)
CO2: 27 mmol/L (ref 20.0–31.0)
CREATININE: 0.82 mg/dL
EGFR CKD-EPI AA MALE: >90 mL/min/{1.73_m2} (ref >=60)
EGFR CKD-EPI NON-AA MALE: >90 mL/min/{1.73_m2} (ref >=60)
GLUCOSE RANDOM: 109 mg/dL (ref 70–179)
POTASSIUM: 3.5 mmol/L (ref 3.4–4.5)
PROTEIN TOTAL: 5.9 g/dL (ref 5.7–8.2)
SODIUM: 142 mmol/L (ref 135–145)

## 2021-01-01 LAB — CREATININE
CREATININE: 0.81 mg/dL
EGFR CKD-EPI AA MALE: >90 mL/min/{1.73_m2} (ref >=60)
EGFR CKD-EPI NON-AA MALE: >90 mL/min/{1.73_m2} (ref >=60)

## 2021-01-01 LAB — BILIRUBIN, TOTAL: BILIRUBIN TOTAL: <0.2 mg/dL — ABNORMAL LOW (ref 0.3–1.2)

## 2021-01-01 LAB — POTASSIUM: POTASSIUM: 3.4 mmol/L (ref 3.4–4.5)

## 2021-01-01 LAB — MAGNESIUM: MAGNESIUM: 1.7 mg/dL (ref 1.6–2.6)

## 2021-01-01 MED ADMIN — irinotecan (CAMPTOSAR) 285.4 mg in dextrose 5 % 500 mL IVPB: 165 mg/m2 | INTRAVENOUS | @ 17:00:00 | Stop: 2021-01-01

## 2021-01-01 MED ADMIN — bevacizumab-bvzr (ZIRABEV) 297.5 mg in sodium chloride (NS) 0.9 % 100 mL IVPB: 5 mg/kg | INTRAVENOUS | @ 21:00:00 | Stop: 2021-01-01

## 2021-01-01 MED ADMIN — dextrose 5 % infusion: 100 mL/h | INTRAVENOUS | @ 17:00:00 | Stop: 2021-01-01

## 2021-01-01 MED ADMIN — fosaprepitant (EMEND) 150 mg in sodium chloride (NS) 0.9 % 100 mL IVPB: 150 mg | INTRAVENOUS | @ 15:00:00 | Stop: 2021-01-01

## 2021-01-01 MED ADMIN — dexAMETHasone (DECADRON) tablet 12 mg: 12 mg | ORAL | @ 15:00:00 | Stop: 2021-01-01

## 2021-01-01 MED ADMIN — leucovorin 346 mg in dextrose 5 % 50 mL IVPB: 200 mg/m2 | INTRAVENOUS | @ 18:00:00 | Stop: 2021-01-01

## 2021-01-01 MED ADMIN — ondansetron (ZOFRAN) tablet 24 mg: 24 mg | ORAL | @ 15:00:00 | Stop: 2021-01-01

## 2021-01-01 MED ADMIN — oxaliplatin (ELOXATIN) 147.05 mg in dextrose 5 % 250 mL chemo infusion: 85 mg/m2 | INTRAVENOUS | @ 18:00:00 | Stop: 2021-01-01

## 2021-01-01 MED ADMIN — fluorouracil (ADRUCIL) 4,150 mg in sodium chloride (NS) 0.9 % 46-hr infusion CADD: 2400 mg/m2 | INTRAVENOUS | @ 21:00:00 | Stop: 2021-01-02

## 2021-01-01 MED ADMIN — sodium chloride (NS) 0.9 % infusion: 100 mL/h | INTRAVENOUS | @ 21:00:00 | Stop: 2021-01-02

## 2021-01-01 NOTE — Unmapped (Signed)
Outpatient Oncology Social Work  Follow Up       Reason for Contact:   Financial Assistance & Insurance Questions    Psychosocial & Case Management Update:  SW met with Pt and his wife in infusion. Pt provided SW with copies of his bills. Pt has a home equity loan through [employer] and pays $551.45/mo. Pt currently owes $246.11 to [employer], $94.41 to [employer], and has a bill for $174.44 with [employer] for propane. Pt also pays $46.98/mo in home insurance and $104.11 in car insurance. He states that he's already paid off the car insurance for this month. SW explained that the foundations may not be able to cover the [employer], Pt reported understanding. Request for assistance was submitted to the Cancer Patient Assistance Fund (CPAF).    Pt reports that he plans on speaking with the HR department regarding short term and long term disability benefits. Pt confirmed that he has applied for SSDI. Pt asked about COBRA and SW explained that if Pt receives a letter from his employer offering COBRA, [employer] will be able to help cover Pt's premiums as long as he is receiving treatment at [facility]. Pt reported understanding.    Pt provided verbal consent for SW to email him information regarding co-pay assistance programs. Currently, the co-pay programs for colon cancer are closed, but SW encouraged Pt and his wife to check-in as the funds may open at any time.    Follow-Up Plan:   Pt has this SW's contact information and will reach out for assistance as needed.      Annice Pih, LCSW  Oncology Outpatient Social Worker  [phone]

## 2021-01-01 NOTE — Unmapped (Signed)
Service:  Hematology and Oncology  Patient Name: Manuel White  Patient Age: 60 y.o.  Encounter Date: 01/01/2021    PRIMARY CARE PHYSICIAN  Hadley Pen, MD  [address] [facility]  [place] [address]    CONSULTING PHYSICIANS  Lesia Sago, Md  [address]  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 Large bowel obstruction (rectosigmoid obstructing mass), s/p loop diverting ostomy 08-03-20  3. Weight loss, stabilized and improving  4. Hearing loss reported with C5  5. Normocytic anemia  6. A fib with one episode SVT inpatient, no recurrence  7. Grade 1 neuropathy    - he is concerned about financial toxicity related to treatment; recommend ongoing and close follow up with social work and Physiological scientist  - he has deferred audiology referral due to cost  - colonoscopy; he would like to defer until next scans due to cost. He understands rationale.  - plan for 8 cycles FOLFOXIRI + avastin with de-escalation to 5-FU/LV + avastin with C10; repeat CT April first or second week  - [employer] handouts provided; discuss at next visit  - Zometa, calcium vit D, discuss at next visit            _____________________________________________________________________   ONCOLOGY HISTORY  Diagnosed age 50 (Sept 2021) with metastatic colorectal cancer.  PMH is notable for kidney stones and A fib.  Reports intermittent diarrhea and abdominal pain since March 2021. CT 07-29-20 with colon mass.  He presented to [facility] the weekend prior to planned palliative colonic stent for large bowel obstruction, and underwent flex sig with diverting loop colonoscopy 08-03-20.  Biopsy consistent with moderately differentiated adenocarcinoma (MSS). Staging CT (OS[facility]) notable for liver, lung and possible L iliac met.    INTERVAL HISTORY:  Accompanied by his wife. He had one episode of nausea lasting for a few hours after the last infusion (no emesis). He reports weakness 2-3 days post infusion.  He is very concerned about payment for care to date and has deferred audiology evaluation due to cost.  Denies: fevers, chills, CP, SOB, cough, abdominal pain, emesis, change in ostomy output, blood through ostomy output.   ??  A 10 point ROS was performed and negative except as noted above in the HPI.    _____________________________________________  OTHER PAST MEDICAL HISTORY / CURRENT PROBLEM LIST    Afib/SVT  HTN  MDD/Anxiety  BPH    Past Medical History:   Diagnosis Date   ??? BPH (benign prostatic hyperplasia)    ??? Colon cancer (CMS-HCC)    ??? Hypertension       _____________________________________________________________________  ALLERGIES    Patient has no known allergies.     _____________________________________________________________________  CURRENT MEDICATIONS    I am having [name] maintain his citalopram, docusate sodium, LORazepam, metoprolol succinate, tamsulosin, fluticasone propionate, oxyCODONE, acetaminophen, multivitamin, loperamide, (  heparin, porcine (PF)), sodium chloride, pegfilgrastim-bmez, HYDROcodone-acetaminophen, SYSTANE BALANCE, ondansetron, promethazine, loratadine, polyethylene glycol, and dexAMETHasone.     _____________________________________________________________________  HABITS    SOCIAL HISTORY  Social History Socioeconomic History   ??? Marital status: Married     Spouse name: Not on file   ??? Number of children: Not on file   ??? Years of education: Not on file   ??? Highest education level: Not on file   Occupational History   ??? Not on file   Tobacco Use   ??? Smoking status: Never Smoker   ??? Smokeless tobacco: Never Used   Vaping Use   ??? Vaping Use: Never used   Substance and Sexual Activity   ??? Alcohol use: Never   ??? Drug use: Never   ??? Sexual activity: Not Currently   Other Topics Concern   ??? Not on file   Social History Narrative   ??? Not on file     Social Determinants of Health     Financial Resource Strain: Low Risk    ??? Difficulty of Paying Living Expenses: Not hard at all   Food Insecurity: No Food Insecurity   ??? Worried About Programme researcher, broadcasting/film/video in the Last Year: Never true   ??? Ran Out of Food in the Last Year: Never true   Transportation Needs: No Transportation Needs   ??? Lack of Transportation (Medical): No   ??? Lack of Transportation (Non-Medical): No   Physical Activity: Not on file   Stress: Not on file   Social Connections: Not on file         FAMILY HISTORY  Mother- Uterine, Breast Ca, deceased     _____________________________________________________________________  PHYSICAL EXAM    VITAL SIGNS: BP 125/80  - Pulse 78  - Temp 36.1 ??C (97 ??F) (Oral)  - Resp 16  - Wt 59.9 kg (132 lb)  - SpO2 100%  - BMI 18.41 kg/m??    Wt Readings from Last 3 Encounters:   01/01/21 59.9 kg (132 lb)   01/01/21 60.1 kg (132 lb 7.9 oz)   12/18/20 59.5 kg (131 lb 2.8 oz)   Gen: Thin man, sitting comfortably on couch, NAD  HEENT: PERRL, non-icteric sclera, no cervical or supraclavicular LAD  CV: RRR, no M/R/G, no LE edema  Pulm: CTAB  AB: +BS, soft, ND, ND, ostomy in place with normal output  Skin: no rash, lesions  Psych: normal affect,    _____________________________________________________________________  LABS    Lab on 01/01/2021   Component Date Value   ??? Creatinine 01/01/2021 0.81    ??? EGFR CKD-EPI Non-African* 01/01/2021 >90    ??? EGFR CKD-EPI African Ame* 01/01/2021 >90    ??? Total Bilirubin 01/01/2021 <0.2*   ??? Potassium 01/01/2021 3.4    ??? Magnesium 01/01/2021 1.7    ??? WBC 01/01/2021 9.2    ??? RBC 01/01/2021 3.48*   ??? HGB 01/01/2021 9.9*   ??? HCT 01/01/2021 32.0*   ??? MCV 01/01/2021 91.9    ??? MCH 01/01/2021 28.4    ??? MCHC 01/01/2021 30.9*   ??? RDW 01/01/2021 19.4*   ??? MPV 01/01/2021 8.6    ??? Platelet 01/01/2021 239    ??? Neutrophils % 01/01/2021 75.1    ??? Lymphocytes % 01/01/2021 14.5    ??? Monocytes % 01/01/2021 4.8    ??? Eosinophils % 01/01/2021 3.4    ??? Basophils % 01/01/2021 0.3    ??? Absolute Neutrophils 01/01/2021 6.9    ??? Absolute Lymphocytes  01/01/2021 1.3*   ??? Absolute Monocytes 01/01/2021 0.4    ??? Absolute Eosinophils 01/01/2021 0.3    ??? Absolute Basophils 01/01/2021 0.0    ??? Large Unstained Cells 01/01/2021 2    ??? Microcytosis 01/01/2021 Slight*   ??? Macrocytosis 01/01/2021 Slight*   ??? Anisocytosis 01/01/2021 Moderate*   ??? Hypochromasia 01/01/2021 Marked*   ??? Spec Gravity/POC 01/01/2021 >=1.030    ??? PH/POC 01/01/2021 5.5    ??? Leuk Esterase/POC 01/01/2021 Negative    ??? Nitrite/POC 01/01/2021 Negative    ??? Protein/POC 01/01/2021 Trace*   ??? UA Glucose/POC 01/01/2021 Negative    ??? Ketones, POC 01/01/2021 Negative    ??? Bilirubin/POC 01/01/2021 1+*   ??? Blood/POC 01/01/2021 Negative    ??? Urobilinogen/POC 01/01/2021 0.2        RADIOLOGY / OTHER DIAGNOSTIC STUDIES  Flexible sigmoidoscopy 08-03-20:  Findings:       Hemorrhoids were found on perianal exam.       A fungating and completely obstructing large mass was found at 16 cm        proximal to the anus. No bleeding was present. Biopsies were taken with        a cold forceps for histology.                                                                                   Impression:            - Hemorrhoids found on perianal exam.                         - Likely malignant completely obstructing tumor at 16                          cm proximal to the anus. Biopsied.    PATHOLOGY  08-03-20:  Addendum   The purpose of this addendum is to report the results of immunohistochemical stains for mismatch repair proteins.    ??  Immunohistochemical stains for MLH1, MSH2, MSH6 and PMS2 were performed on block A1.  The tumor cells demonstrate appropriate nuclear expression for all four markers.  This is the normal phenotype and does NOT support a diagnosis of microsatellite instability/hereditary non-polyposis colorectal cancer (HNPCC).  Separate molecular testing for microsatellite instability markers will be performed by the [employer] (phone # [phone]) and reported separately.    ??  This result may have implications for therapy selection, as MSS/mismatch repair proficient tumors are less likely to respond to immune modulating agents like Pembrolizumab (1).  ??  1) Le DT, et al.?? [employer] J Med. 2015 Jun 25;372(26):2509-20. PMID: [id number]  ??  The original diagnoses are unchanged.  ??  Selected tissue block for possible future molecular pathology studies:  A1  ??  ??   Addendum electronically signed by Lyla Glassing, MD on 08/05/2020 at 1621   Final Diagnosis   A: Colon, rectosigmoid tumor, biopsy  - Invasive moderately differentiated adenocarcinoma associated with scant fragments of a tubulovillous adenoma with high-grade dysplasia  - No lymphovascular space invasion identified  - See comment  Imaging:  CT chest 11-23-20:  1. Numerous pulmonary nodules, involving all 5 lobes, most of which appear cavitary. Those nodules imaged on the previous CT abdomen demonstrate interval decreased size and increased cavitation since previous.  2. Mildly dilated ascending thoracic aorta.    CT AP 11-23-20:  Focal masslike thickening of the sigmoid colon consistent with known adenocarcinoma is mildly decreased in size.  ??  Interval left lower quadrant diverting loop sigmoid colostomy. Large colonic stool burden with no evidence of obstruction.  ??  Numerous hepatic metastasis are overall decreased in size and enhancement suggestive of treatment response.  ??  Stable partially calcified retroperitoneal lymphadenopathy.  ??  Unchanged mixed lytic and sclerotic left iliac lesion.  ??  No evidence of new metastatic disease within the abdomen/pelvis.  ??    Bone scan 09-18-20:  Slight increased uptake in the medial aspect of the of the left iliac bone corresponding to the lucency surrounded by sclerotic change seen on CT dated 07/29/20.

## 2021-01-01 NOTE — Unmapped (Signed)
Port accessed and labs drawn with no complications.  Port flushed with saline.  Patient ambulatory from lab.  POC urine collected.

## 2021-01-01 NOTE — Unmapped (Signed)
Labs found to be within parameters for treatment today. Request for drug sent to pharmacy.

## 2021-01-01 NOTE — Unmapped (Signed)
Frequently Asked Questions on the Emergency Use Authorization for Evusheld (tixagevimab co-packaged with cilgavimab) for Pre-exposure Prophylaxis (PrEP) of COVID-19    Q: What is an Emergency Use Authorization (EUA)?  A: Under section 564 of the [employer], Drug & Cosmetic Act, after a declaration by the [facility] Secretary based on one of four types of determinations, FDA may authorize an unapproved product or unapproved uses of an approved product for emergency use. In issuing an EUA, FDA must determine, among other things, that based on the totality of scientific evidence available to the Agency, including data from adequate and well-controlled clinical trials, if available, it is reasonable to believe that the product may be effective in diagnosing, treating, or preventing a serious or life-threatening disease or condition caused by a chemical, biological, radiological, or nuclear agent; that the known and potential benefits, when used to treat, diagnose or prevent such disease or condition, outweigh the known and potential risks for the product; and that there are no adequate, approved, and available alternatives. Emergency use authorization is NOT the same as FDA approval or licensure.    Q: What does this EUA authorize? What are the limitations of authorized use?  A: The EUA authorizes [employer]'s Evusheld (tixagevimab co-packaged with cilgavimab) for emergency use as pre-exposure prophylaxis for prevention of COVID-19 in adults and pediatric individuals (47 years of age and older weighing at least 40 kg):    ? Who are not currently infected with SARS-CoV-2 and who have not had a known recent exposure to an individual infected with SARS-CoV-2 and  o Who have moderate to severe immune compromise due to a medical condition or receipt of immunosuppressive medications or treatments and may not mount an adequate immune response to COVID-19 vaccination or  o For whom vaccination with any available COVID-19 vaccine, according to the approved or authorized schedule, is not recommended due to a history of severe adverse reaction (e.g., severe allergic reaction) to a COVID-19 vaccine(s) and/or COVID-19 vaccine component(s).    Limitations of Authorized Use    ? Evusheld is not authorized for use in individuals:  o For treatment of COVID-19, or  o For post-exposure prophylaxis of COVID-19 in individuals who have been exposed to someone infected with SARS-CoV-2.  ? Pre-exposure prophylaxis with Evusheld is not a substitute for vaccination in individuals for whom COVID-19 vaccination is recommended. Individuals for whom COVID-19 vaccination is recommended, including individuals with moderate to severe immune compromise who may derive benefit from COVID-19 vaccination, should receive COVID-19 vaccination.  ? In individuals who have received a COVID-19 vaccine, Evusheld should be administered at least two weeks after vaccination.    Q: What are some medical conditions or treatments that may lead to an inadequate immune response to the COVID-19 vaccination?  A: Medical conditions or treatments that may result in moderate to severe immunocompromise and an inadequate immune response to COVID-19 vaccination include but are not limited to:  ??? Active treatment for solid tumor and hematologic malignancies  ??? Receipt of solid-organ transplant and taking immunosuppressive therapy  ??? Receipt of chimeric antigen receptor (CAR)-T-cell or hematopoietic stem cell transplant (within 2 years of transplantation or taking immunosuppression therapy)  ??? Moderate or severe primary immunodeficiency (e.g., DiGeorge syndrome, Wiskott-Aldrich syndrome)  ??? Moderate or severe primary immunodeficiency (e.g., DiGeorge syndrome, Wiskott-Aldrich syndrome)  ??? Active treatment with high-dose corticosteroids (i.e., ?20 mg prednisone or equivalent per day when administered for ?2 weeks), alkylating agents, antimetabolites, transplant-related immunosuppressive drugs, cancer chemotherapeutic agents classified as severely immunosuppressive,  tumor-necrosis (TNF) blockers, and other biologic agents that are immunosuppressive or immunomodulatory (e.g., B-cell depleting agents)    For additional information, refer to the [facility] Vaccines & Immunizations website.    Q: Is Evusheld approved by the FDA to prevent or treat COVID-19?  A: No. Evusheld is not FDA-approved to prevent or treat any diseases or conditions, including COVID-19. Evusheld is an investigational drug.     Q: How can Evusheld be obtained for use under the EUA?  A: For questions on how to obtain Evusheld, please contact [email or web address] .gov.     Q: Are tixagevimab and cilgavimab monoclonal antibodies? What is a monoclonal antibody?   A: Yes, tixagevimab and cilgavimab are monoclonal antibodies. Monoclonal antibodies are laboratory-produced molecules engineered to serve as substitute antibodies that can restore, enhance or mimic the immune system's attack on pathogens. Evusheld is designed to block viral attachment and entry into human cells, thus neutralizing the virus.     Q: When should Evusheld be administered to a patient?  A: Patients should talk to their healthcare provider to determine whether, based on their individual circumstances, they are eligible to receive Evusheld, and when it should be administered.    More information about administration is available in the Fact Sheet for Health Care Providers.    Q: Are there potential side effects of Evusheld?  A: Possible side effects of Evusheld include the following:     Allergic reactions can happen during and after injection of Evusheld. Reactions to Evusheld may include difficulty breathing or swallowing; shortness of breath; wheezing; swelling of the face, lips, tongue or throat; rash including hives; or itching.    The side effects of getting any medicine by intramuscular injection may include pain, bruising of the skin, soreness, swelling, and possible bleeding or infection at the injection site.     Serious cardiac adverse events (such as myocardial infarction and heart failure) were infrequent in the clinical trial evaluating Evusheld for pre-exposure prophylaxis for prevention. However, more trial participants had serious cardiac adverse events after receiving Evusheld compared to placebo. These participants all had risk factors for cardiac disease or a history of cardiovascular disease before participating in the clinical trial. It is not clear if Evusheld caused these cardiac adverse events.    These are not all the possible side effects of Evusheld. Not a lot of people have been given Evusheld. Serious and unexpected side effects may happen. Evusheld is still being studied so it is possible that all of the risks are not known at this time.    Q: Are there reporting requirements for health care facilities and providers as part of the EUA?  A: Yes. As part of the EUA, FDA requires health care providers who prescribe Evusheld to report all medication errors and serious adverse events considered to be potentially related to Evusheld through FDA's MedWatch Adverse Event Reporting program. Providers can complete and submit the report online; or download and complete the form, then submit it via fax at 1-800-FDA-0178. This requirement is outlined in the EUA's health care provider Fact Sheet. FDA MedWatch forms should also be provided to [employer].    Health care facilities and providers must report therapeutics information and utilization data as directed by the U.S. Department of Health and [employer]. Such information and data should be reported through [facility] Protect, [employer], or [employer] (NHSN).    Q: Do patient outcomes need to be reported under the EUA?  A: No, reporting of patient outcomes is  not required under the EUA. However, reporting of all medication errors and serious adverse events considered to be potentially related to Evusheld occurring during treatment is required.    Q: Can health care providers share the patient/caregiver Fact Sheet electronically?  A: The letter of authorization for Evusheld, requires that Fact Sheets be made available to health care providers and to patients/caregivers ???through appropriate means.??? Electronic delivery of the patient/caregiver Fact Sheet is an appropriate means. For example, when the patient requests the Fact Sheet electronically, it can be delivered as a PDF prior to medication administration. Health care providers should confirm receipt of the Fact Sheet with the patient.     Q: Can I receive Evusheld if I recently received a COVID-19 vaccine?  A: Evusheld may reduce your body's immune response to a COVID-19 vaccine. If you receive a COVID-19 vaccine, you should wait to receive Evusheld until at least two weeks after your COVID-19 vaccination.     Q: Are there data showing Evusheld may provide benefit for pre-exposure prophylaxis for prevention of COVID-19 in certain patients?  A: Yes. The most important scientific evidence supporting the authorization of Evusheld is from [employer], a randomized, double-blind, placebo-controlled clinical trial in adults who had not received a COVID-19 vaccine and did not have a history of SARS-CoV-2 infection or test positive for SARS-CoV-2 infection at the start of the trial. All trial participants were either ?61 years of age, had a pre-specified co-morbidity (obesity, congestive heart failure, chronic obstructive pulmonary disease, chronic kidney disease, chronic liver disease, immunocompromised state, or previous history of severe or serious adverse event after receiving any approved vaccine), or were at increased risk of SARS-CoV-2 infection due to their living situation or occupation.  The main outcome measured in the trial was whether the trial participant had a case of documented COVID-19 after receiving Evusheld or placebo and before day 183 of the trial. In this trial, 3,441 people received Evusheld and 1,731 received a placebo. In the primary analysis, Evusheld recipients saw a 77% reduced risk of developing COVID-19 compared to those who received a placebo, a statistically significant difference. In additional analyses, the reduction in risk of developing COVID-19 was maintained for Evusheld recipients through six months. The safety and effectiveness of this investigational therapy for use in the pre-exposure prevention of COVID-19 continue to be evaluated.  Details on the clinical trial results can be found in Section 14 of the authorized Fact Sheet for Health Care Providers.    Last Updated: 10/28/2020    Fact Sheet for Patients, Parents And Caregivers Emergency Use Authorization (EUA) of EVUSHELD??? (tixagevimab co-packaged with cilgavimab) for Coronavirus Disease 2019 (COVID-19)      You are being given this Fact Sheet because your healthcare provider believes it is necessary to provide you with EVUSHELD (tixagevimab co-packaged with cilgavimab) for pre-exposure prophylaxis for prevention of coronavirus disease 2019 (COVID-19) caused by the SARS-CoV-2 virus.    This Fact Sheet contains information to help you understand the potential risks and potential benefits of taking EVUSHELD, which you have received or may receive.    The U.S. Food and Drug Administration (FDA) has issued an Emergency Use Authorization (EUA) to make EVUSHELD available during the COVID-19 pandemic (for more details about an EUA please see ???What is an Emergency Use Authorization???? at the end of this document). EVUSHELD is not an FDA-approved medicine in the [place].    Read this Fact Sheet for information about EVUSHELD. Talk to your healthcare provider if you have any  questions. It is your choice to receive or not receive EVUSHELD.    What is COVID-19?   COVID-19 is caused by a virus called a coronavirus. You can get COVID-19 through close contact with another person who has the virus.    COVID-19 illnesses have ranged from very mild (including some with no reported symptoms) to severe, including illness resulting in death. While information so far suggests that most COVID-19 illness is mild, serious illness can happen and may cause some of your other medical conditions to become worse. Older people and people of all ages with severe, long-lasting (chronic) medical conditions like heart disease, lung disease, and diabetes, for example, seem to be at higher risk of being hospitalized for COVID-19.    What is EVUSHELD (tixagevimab co-packaged with cilgavimab)?  EVUSHELD is an investigational medicine used in adults and adolescents (87 years of age and older who weigh at least 88 pounds [40 kg]) for preexposure prophylaxis for prevention of COVID-19 in persons who are:  ??? not currently infected with SARS-CoV-2 and who have not had recent known close contact with someone who is infected with SARS-CoV-2 and  o Who have moderate to severe immune compromise due to a medical condition or have received immunosuppressive medicines or treatments and may not mount an adequate immune response to COVID-19 vaccination or  o For whom vaccination with any available COVID-19 vaccine, according to the approved or authorized schedule, is not recommended due to a history of severe adverse reaction (such as severe allergic reaction) to a COVID-19 vaccine(s) or COVID-19 vaccine ingredient(s).    EVUSHELD is investigational because it is still being studied. There is limited information known about the safety and effectiveness of using EVUSHELD for pre-exposure prophylaxis for prevention of COVID-19. EVUSHELD is not authorized for post-exposure prophylaxis for prevention of COVID-19.    The FDA has authorized the emergency use of EVUSHELD for pre-exposure prophylaxis for prevention of COVID-19 under an Emergency Use Authorization (EUA).    What should I tell my healthcare provider before I receive EVUSHELD? Tell your healthcare provider if you:  ??? Have any allergies   ??? Have low numbers of blood platelets (which help blood clotting), a bleeding disorder, or are taking anticoagulants (to prevent blood clots)  ??? Have had a heart attack or stroke, have other heart problems, or are at highrisk of cardiac (heart) events  ??? Are pregnant or plan to become pregnant  ??? Are breastfeeding a child  ??? Have any serious illness  ??? Are taking any medications (prescription, over-the-counter, vitamins, or herbal products)    How will I receive EVUSHELD?  ??? EVUSHELD consists of two investigational medicines, tixagevimab and cilgavimab.  ??? You will receive 1 dose of EVUSHELD, consisting of 2 separate injections (tixagevimab and cilgavimab).  ??? EVUSHELD will be given to you by your healthcare provider as 2 intramuscular injections. They are usually, given one after the other, 1 into each of your buttocks.    After the initial dose, if your healthcare provider determines that you need to receive additional doses of EVUSHELD for ongoing protection, the additional doses would be administered once every 6 months.    Who should generally not take EVUSHELD?  Do not take EVUSHELD if you have had a severe allergic reaction to EVUSHELD or any ingredient in EVUSHELD.    What are the important possible side effects of EVUSHELD?  Possible side effects of EVUSHELD are:   ??? Allergic reactions. Allergic reactions can happen during and after  injection of EVUSHELD. Tell your healthcare provider right away if you get any of the following signs and symptoms of allergic reactions: fever, chills, nausea, headache, shortness of breath, low or high blood pressure, rapid or slow heart rate, chest discomfort or pain, weakness, confusion, feeling tired, wheezing, swelling of your lips, face, or throat, rash including hives, itching, muscle aches, dizziness and sweating. These reactions may be severe or life threatening.  ??? Cardiac (heart) events: Serious cardiac adverse events have happened, but were not common, in people who received EVUSHELD and also in people who did not receive EVUSHELD in the clinical trial studying pre-exposure prophylaxis for prevention of COVID-19. In people with risk factors for cardiac events (including a history of heart attack), more people who received EVUSHELD experienced serious cardiac events than people who did not receive EVUSHELD. It is not known if these events are related to [facility] or underlying medical conditions. Contact your healthcare provider or get medical help right away if you get any symptoms of cardiac events, including pain, pressure, or discomfort in the chest, arms, neck, back, stomach or jaw, as well as shortness of breath, feeling tired or weak (fatigue), feeling sick (nausea), or swelling in your ankles or lower legs.    The side effects of getting any medicine by intramuscular injection may include pain, bruising of the skin, soreness, swelling, and possible bleeding or infection at the injection site.    These are not all the possible side effects of EVUSHELD. Not a lot of people have been given EVUSHELD. Serious and unexpected side effects may happen. EVUSHELD is still being studied so it is possible that all of the risks are not known at this time.    It is possible that EVUSHELD may reduce your body???s immune response to a COVID-19 vaccine. If you have received a COVID-19 vaccine, you should wait to receive EVUSHELD until at least 2 weeks after COVID-19 vaccination.    What other prevention choices are there?  Vaccines to prevent COVID-19 are approved or available under Emergency Use Authorization. Use of EVUSHELD does not replace vaccination against COVID-19. For more information about other medicines authorized for treatment or prevention of COVID-19 go to ThenWeb.com.ee for more information.    It is your choice to receive or not receive EVUSHELD. Should you decide not to receive EVUSHELD, it will not change your standard medical care.    EVUSHELD is not authorized for post-exposure prophylaxis of COVID-19.    What if I am pregnant or breastfeeding?  If you are pregnant or breastfeeding, discuss your options and specific situation with your healthcare provider.     How do I report side effects with EVUSHELD?  Contact your healthcare provider if you have any side effects that bother you or do not go away. Report side effects to FDA MedWatch at [employer]reat.be or call 1-800-FDA-1088 or call [employer] at [phone].    Additional Information  If you have questions, visit the website or call the telephone number provided below.    To access the most recent EVUSHELD Fact Sheets, please scan the QR code provided below.  Website Telephone number   [email or web address]       [phone]     How can I learn more about COVID-19?  ??? Ask your healthcare provider.  ??? Visit [email or web address]  ??? Contact your local or state public health department.    What is an Emergency Use Authorization?  The [place] FDA has made EVUSHELD (tixagevimab co-packaged with cilgavimab) available under  an emergency access mechanism called an Emergency Use Authorization EUA. The EUA is supported by a Surveyor, minerals and Human Service (HHS) declaration that circumstances exist to justify the emergency use of drugs and biological products during the COVID-19 pandemic.    EVUSHELD for pre-exposure prophylaxis for prevention of coronavirus disease 2019 (COVID-19) caused by the SARS-CoV-2 virus has not undergone the same type of review as an FDA-approved product. In issuing an EUA under the COVID-19 public health emergency, the FDA has determined, among other things, that based on the total amount of scientific evidence available including data from adequate and wellcontrolled clinical trials, if available, it is reasonable to believe that the product may be effective for diagnosing, treating, or preventing COVID-19, or a serious or lifethreatening disease or condition caused by COVID-19; that the known and potential benefits of the product, when used to diagnose, treat, or prevent such disease or condition, outweigh the known and potential risks of such product; and that there are no adequate, approved and available alternatives.    All of these criteria must be met to allow for the product to be used in the treatment of patients during the COVID-19 pandemic. The EUA for EVUSHELD is in effect for the duration of the COVID-19 declaration justifying emergency use of EVUSHELD, unless terminated or revoked (after which EVUSHELD may no longer be used under the EUA).      Distributed by: [employer], [place], [place] [address]    Manufactured by: [employer], [address], [place], [place] [address], [place]    ??[employer] 2021. All rights reserved.

## 2021-01-02 NOTE — Unmapped (Signed)
Patient tx completed. Pump connected and infusing. Patient discharged alert, oriented, and in stable condition.

## 2021-01-04 LAB — VITAMIN D 25 HYDROXY: VITAMIN D, TOTAL (25OH): 19.9 ng/mL — ABNORMAL LOW (ref 20.0–80.0)

## 2021-01-04 NOTE — Unmapped (Signed)
Outpatient Oncology Social Work  Follow Up       Reason for Contact:   Questions    Psychosocial & Case Management Update:  Pt reached out to SW with questions regarding [employer] and assistance with bills. Pt reports that he is concerned about racking up more medical bills, which is why he put off an audiology exam as well as another CT scan. Pt mentioned difficulty paying the co-pays, stating that there was one bill for $21000 and the insurance paid $19000, but he owes $2000. He reports that there are other bills as well, which are adding up, resulting in an increase in his stress level. SW encouraged Pt to check foundations for co-pay assistance programs and to submit an application once the funds for his diagnosis are open. The co-pay programs offer funds to provide assistance with co-pays, coinsurance, office visits, etc. Pt reported understanding.     Pt's FA application was denied on 01/04/21 due to being incomplete. SW was able to find a list of which documents were missing and explained to Pt, who states that he will work on getting the documents. Pt was already screened for Medicaid eligibility by the Financial Navigation Unit on 11/27/20.     SW received notification that the Cancer Patient Assistance Fund (CPAF) was able to cover Pt's [employer] [phone]), and Pt's [employer] ($104). CPAF also paid $175 to [employer] and [employer], some of which will appear as credit. Pt's [employer] bill was already paid, balance was zero. Coordinator Cindie Crumbly reached out to Pt to ask if any further assistance is needed. SW also left a message for Pt providing an update.    Follow-Up Plan:   Pt states that he will work on getting the documents that are missing from his Financial Assistance application and provide them for SW to submit.  Pt has this SW's contact information and will reach out for assistance as needed.      Annice Pih, LCSW  Oncology Outpatient Social Worker  [phone]

## 2021-01-08 NOTE — Unmapped (Signed)
Faxed progress notes/paperwork from 12/22/2020 - Present Day to [place] [facility] [place] at [phone]    Emailed Jake Samples to make her aware.

## 2021-01-11 MED FILL — ZIEXTENZO 6 MG/0.6 ML SUBCUTANEOUS SYRINGE: 28 days supply | Qty: 1.2 | Fill #3

## 2021-01-15 ENCOUNTER — Encounter: Admit: 2021-01-15 | Discharge: 2021-01-15 | Payer: PRIVATE HEALTH INSURANCE

## 2021-01-15 DIAGNOSIS — C189 Malignant neoplasm of colon, unspecified: Principal | ICD-10-CM

## 2021-01-15 DIAGNOSIS — C787 Secondary malignant neoplasm of liver and intrahepatic bile duct: Principal | ICD-10-CM

## 2021-01-15 LAB — COMPREHENSIVE METABOLIC PANEL
ALBUMIN: 2.9 g/dL — ABNORMAL LOW (ref 3.4–5.0)
ALKALINE PHOSPHATASE: 244 U/L — ABNORMAL HIGH (ref 46–116)
ALT (SGPT): 28 U/L (ref 10–49)
ANION GAP: 4 mmol/L — ABNORMAL LOW (ref 5–14)
AST (SGOT): 33 U/L (ref <=34)
BILIRUBIN TOTAL: 0.2 mg/dL — ABNORMAL LOW (ref 0.3–1.2)
BLOOD UREA NITROGEN: 14 mg/dL (ref 9–23)
BUN / CREAT RATIO: 17
CALCIUM: 8.7 mg/dL (ref 8.7–10.4)
CHLORIDE: 105 mmol/L (ref 98–107)
CO2: 31 mmol/L (ref 20.0–31.0)
CREATININE: 0.83 mg/dL
EGFR CKD-EPI AA MALE: >90 mL/min/{1.73_m2} (ref >=60)
EGFR CKD-EPI NON-AA MALE: >90 mL/min/{1.73_m2} (ref >=60)
GLUCOSE RANDOM: 124 mg/dL (ref 70–179)
POTASSIUM: 3.7 mmol/L (ref 3.5–5.1)
PROTEIN TOTAL: 5.5 g/dL — ABNORMAL LOW (ref 5.7–8.2)
SODIUM: 140 mmol/L (ref 135–145)

## 2021-01-15 LAB — CBC W/ AUTO DIFF
BASOPHILS ABSOLUTE COUNT: 0 10*9/L (ref 0.0–0.1)
BASOPHILS RELATIVE PERCENT: 0.4 %
EOSINOPHILS ABSOLUTE COUNT: 0.2 10*9/L (ref 0.0–0.4)
EOSINOPHILS RELATIVE PERCENT: 2 %
HEMATOCRIT: 32.5 % — ABNORMAL LOW (ref 41.0–53.0)
HEMOGLOBIN: 10.5 g/dL — ABNORMAL LOW (ref 13.5–17.5)
LARGE UNSTAINED CELLS: 2 % (ref 0–4)
LYMPHOCYTES ABSOLUTE COUNT: 1.7 10*9/L (ref 1.5–5.0)
LYMPHOCYTES RELATIVE PERCENT: 17.3 %
MEAN CORPUSCULAR HEMOGLOBIN CONC: 32.2 g/dL (ref 31.0–37.0)
MEAN CORPUSCULAR HEMOGLOBIN: 29 pg (ref 26.0–34.0)
MEAN CORPUSCULAR VOLUME: 90.1 fL (ref 80.0–100.0)
MEAN PLATELET VOLUME: 8.4 fL (ref 7.0–10.0)
MONOCYTES ABSOLUTE COUNT: 0.6 10*9/L (ref 0.2–0.8)
MONOCYTES RELATIVE PERCENT: 6 %
NEUTROPHILS ABSOLUTE COUNT: 6.9 10*9/L (ref 2.0–7.5)
NEUTROPHILS RELATIVE PERCENT: 72.6 %
PLATELET COUNT: 282 10*9/L (ref 150–440)
RED BLOOD CELL COUNT: 3.61 10*12/L — ABNORMAL LOW (ref 4.50–5.90)
RED CELL DISTRIBUTION WIDTH: 19.3 % — ABNORMAL HIGH (ref 12.0–15.0)
WBC ADJUSTED: 9.6 10*9/L (ref 4.5–11.0)

## 2021-01-15 LAB — SLIDE REVIEW

## 2021-01-15 LAB — MAGNESIUM: MAGNESIUM: 1.6 mg/dL (ref 1.6–2.6)

## 2021-01-15 LAB — CEA: CARCINOEMBRYONIC ANTIGEN: 25 ng/mL — ABNORMAL HIGH (ref 0.0–5.0)

## 2021-01-15 MED ADMIN — oxaliplatin (ELOXATIN) 147.05 mg in dextrose 5 % 250 mL chemo infusion: 85 mg/m2 | INTRAVENOUS | @ 17:00:00 | Stop: 2021-01-15

## 2021-01-15 MED ADMIN — irinotecan (CAMPTOSAR) 285.4 mg in dextrose 5 % 500 mL IVPB: 165 mg/m2 | INTRAVENOUS | @ 15:00:00 | Stop: 2021-01-15

## 2021-01-15 MED ADMIN — sodium chloride (NS) 0.9 % infusion: 100 mL/h | INTRAVENOUS | @ 19:00:00

## 2021-01-15 MED ADMIN — fluorouracil (ADRUCIL) 4,150 mg in sodium chloride (NS) 0.9 % 46-hr infusion CADD: 2400 mg/m2 | INTRAVENOUS | @ 19:00:00

## 2021-01-15 MED ADMIN — ondansetron (ZOFRAN) tablet 24 mg: 24 mg | ORAL | @ 14:00:00 | Stop: 2021-01-15

## 2021-01-15 MED ADMIN — atropine injection: .4 mg | INTRAVENOUS | @ 15:00:00

## 2021-01-15 MED ADMIN — leucovorin 346 mg in dextrose 5 % 50 mL IVPB: 200 mg/m2 | INTRAVENOUS | @ 17:00:00 | Stop: 2021-01-15

## 2021-01-15 MED ADMIN — fosaprepitant (EMEND) 150 mg in sodium chloride (NS) 0.9 % 100 mL IVPB: 150 mg | INTRAVENOUS | @ 15:00:00 | Stop: 2021-01-15

## 2021-01-15 MED ADMIN — dextrose 5 % infusion: 100 mL/h | INTRAVENOUS | @ 15:00:00 | Stop: 2021-01-15

## 2021-01-15 MED ADMIN — dexAMETHasone (DECADRON) tablet 12 mg: 12 mg | ORAL | @ 14:00:00 | Stop: 2021-01-15

## 2021-01-15 MED ADMIN — bevacizumab-bvzr (ZIRABEV) 297.5 mg in sodium chloride (NS) 0.9 % 100 mL IVPB: 5 mg/kg | INTRAVENOUS | @ 19:00:00 | Stop: 2021-01-15

## 2021-01-15 NOTE — Unmapped (Unsigned)
Port accessed and labs drawn with no complications.  Port flushed with saline. POC urine sent to infusion.  Patient ambulatory from lab.

## 2021-01-16 NOTE — Unmapped (Signed)
Patient arrived to chair 54.  No complaints noted.  Access of port intact with blood return. Pre-medicated per treatment plan orders. Patient stated that he normally takes atropine with irinotecan. Patient completed and tolerated infusion. Line checked for blood return and flushed. CADD pump connected. All clamps opened, green light flashing, pump reads running, and home supplies sent home with patient. AVS declined. Patient discharged to home, NAD.

## 2021-01-18 NOTE — Unmapped (Signed)
Outpatient Oncology Social Work  Follow Up       Psychosocial & Case Management Update:  Pt called SW to ask for an update on the status of his [facility] FA application. SW informed Pt that the application is pending. Pt reports that he wants to make sure he understands everything going on. He states that he received a call about SSDI, he should start receiving payments in mid-March. He reports that he currently gets short-term disability, which will transition to long-term disability payments and understands that he will receive a smaller amount of long-term disability payments because he will be receiving SSDI.     On 2/18, [facility] made another payment of $104.11 toward's Pt's car insurance to [employer].      SW submitted a request for financial assistance to [employer]. Request was made to cover $308.75 of Pt's home insurance bill and $104.11 of Pt's car insurance bill.     Pt reports that he applied for the [facility] Treatment Award through the [employer] and is waiting to hear back.    Follow-Up Plan:   SW to follow-up with Pt once [facility] responds to the request.  Pt has this SW's contact information and will reach out for assistance as needed.      Annice Pih, LCSW  Oncology Outpatient Social Worker  [phone]

## 2021-01-29 ENCOUNTER — Encounter
Admit: 2021-01-29 | Discharge: 2021-01-29 | Payer: PRIVATE HEALTH INSURANCE | Attending: Internal Medicine | Primary: Internal Medicine

## 2021-01-29 ENCOUNTER — Encounter: Admit: 2021-01-29 | Discharge: 2021-01-29 | Payer: PRIVATE HEALTH INSURANCE

## 2021-01-29 DIAGNOSIS — C787 Secondary malignant neoplasm of liver and intrahepatic bile duct: Principal | ICD-10-CM

## 2021-01-29 DIAGNOSIS — C189 Malignant neoplasm of colon, unspecified: Principal | ICD-10-CM

## 2021-01-29 DIAGNOSIS — T451X5A Adverse effect of antineoplastic and immunosuppressive drugs, initial encounter: Principal | ICD-10-CM

## 2021-01-29 DIAGNOSIS — D701 Agranulocytosis secondary to cancer chemotherapy: Principal | ICD-10-CM

## 2021-01-29 LAB — CBC W/ AUTO DIFF
BASOPHILS ABSOLUTE COUNT: 0 10*9/L (ref 0.0–0.1)
BASOPHILS RELATIVE PERCENT: 0.3 %
EOSINOPHILS ABSOLUTE COUNT: 0.1 10*9/L (ref 0.0–0.5)
EOSINOPHILS RELATIVE PERCENT: 0.7 %
HEMATOCRIT: 28 % — ABNORMAL LOW (ref 39.0–48.0)
HEMOGLOBIN: 9.4 g/dL — ABNORMAL LOW (ref 12.9–16.5)
LYMPHOCYTES ABSOLUTE COUNT: 1.3 10*9/L (ref 1.1–3.6)
LYMPHOCYTES RELATIVE PERCENT: 12.7 %
MEAN CORPUSCULAR HEMOGLOBIN CONC: 33.5 g/dL (ref 32.0–36.0)
MEAN CORPUSCULAR HEMOGLOBIN: 28.3 pg (ref 25.9–32.4)
MEAN CORPUSCULAR VOLUME: 84.6 fL (ref 77.6–95.7)
MEAN PLATELET VOLUME: 7.3 fL (ref 6.8–10.7)
MONOCYTES ABSOLUTE COUNT: 0.8 10*9/L (ref 0.3–0.8)
MONOCYTES RELATIVE PERCENT: 7.7 %
NEUTROPHILS ABSOLUTE COUNT: 8.3 10*9/L — ABNORMAL HIGH (ref 1.8–7.8)
NEUTROPHILS RELATIVE PERCENT: 78.6 %
PLATELET COUNT: 228 10*9/L (ref 150–450)
RED BLOOD CELL COUNT: 3.32 10*12/L — ABNORMAL LOW (ref 4.26–5.60)
RED CELL DISTRIBUTION WIDTH: 20.3 % — ABNORMAL HIGH (ref 12.2–15.2)
WBC ADJUSTED: 10.5 10*9/L (ref 3.6–11.2)

## 2021-01-29 LAB — SLIDE REVIEW

## 2021-01-29 LAB — POTASSIUM: POTASSIUM: 3.7 mmol/L (ref 3.4–4.8)

## 2021-01-29 LAB — CREATININE
CREATININE: 0.78 mg/dL
EGFR CKD-EPI AA MALE: >90 mL/min/{1.73_m2} (ref >=60)
EGFR CKD-EPI NON-AA MALE: >90 mL/min/{1.73_m2} (ref >=60)

## 2021-01-29 LAB — MAGNESIUM: MAGNESIUM: 1.5 mg/dL — ABNORMAL LOW (ref 1.6–2.6)

## 2021-01-29 LAB — BILIRUBIN, TOTAL: BILIRUBIN TOTAL: 0.2 mg/dL — ABNORMAL LOW (ref 0.3–1.2)

## 2021-01-29 MED ORDER — GABAPENTIN 300 MG CAPSULE
ORAL_CAPSULE | Freq: Three times a day (TID) | ORAL | 3 refills | 90 days | Status: CP
Start: 2021-01-29 — End: 2022-01-29

## 2021-01-29 MED ORDER — ZIEXTENZO 6 MG/0.6 ML SUBCUTANEOUS SYRINGE
3 refills | 0 days
Start: 2021-01-29 — End: ?

## 2021-01-29 MED ORDER — TAMSULOSIN 0.4 MG CAPSULE
ORAL_CAPSULE | Freq: Every day | ORAL | 0 refills | 90 days | Status: CP
Start: 2021-01-29 — End: ?

## 2021-01-29 MED ORDER — MAGNESIUM OXIDE 400 MG (241.3 MG MAGNESIUM) TABLET
ORAL_TABLET | Freq: Every day | ORAL | 1 refills | 200.00000 days | Status: CP
Start: 2021-01-29 — End: 2022-01-29

## 2021-01-29 MED ADMIN — dexAMETHasone (DECADRON) tablet 12 mg: 12 mg | ORAL | @ 19:00:00 | Stop: 2021-01-29

## 2021-01-29 MED ADMIN — leucovorin 346 mg in dextrose 5 % 50 mL IVPB: 200 mg/m2 | INTRAVENOUS | @ 20:00:00 | Stop: 2021-01-29

## 2021-01-29 MED ADMIN — fluorouracil (ADRUCIL) 4,150 mg in sodium chloride (NS) 0.9 % 46-hr infusion CADD: 2400 mg/m2 | INTRAVENOUS | @ 23:00:00

## 2021-01-29 MED ADMIN — dextrose 5 % infusion: 100 mL/h | INTRAVENOUS | @ 19:00:00 | Stop: 2021-01-29

## 2021-01-29 MED ADMIN — irinotecan (CAMPTOSAR) 285.4 mg in dextrose 5 % 500 mL IVPB: 165 mg/m2 | INTRAVENOUS | @ 20:00:00 | Stop: 2021-01-29

## 2021-01-29 MED ADMIN — bevacizumab-bvzr (ZIRABEV) 297.5 mg in sodium chloride (NS) 0.9 % 100 mL IVPB: 5 mg/kg | INTRAVENOUS | @ 22:00:00 | Stop: 2021-01-29

## 2021-01-29 MED ADMIN — sodium chloride (NS) 0.9 % infusion: 100 mL/h | INTRAVENOUS | @ 22:00:00

## 2021-01-29 MED ADMIN — ondansetron (ZOFRAN) tablet 24 mg: 24 mg | ORAL | @ 19:00:00 | Stop: 2021-01-29

## 2021-01-29 MED ADMIN — atropine injection: .4 mg | INTRAVENOUS | @ 20:00:00

## 2021-01-29 MED ADMIN — fosaprepitant (EMEND) 150 mg in sodium chloride (NS) 0.9 % 100 mL IVPB: 150 mg | INTRAVENOUS | @ 19:00:00 | Stop: 2021-01-29

## 2021-01-29 MED ADMIN — magnesium oxide (MAG-OX) tablet 800 mg: 800 mg | ORAL | @ 19:00:00 | Stop: 2021-01-29

## 2021-01-29 NOTE — Unmapped (Signed)
Patient found to be hypomagnesmic. Will replete with Mg Oxide 800mg  po x 1      Lab Results   Component Value Date    WBC 10.5 01/29/2021    HGB 9.4 (L) 01/29/2021    HCT 28.0 (L) 01/29/2021    PLT 228 01/29/2021       Lab Results   Component Value Date    NA 140 01/15/2021    K 3.7 01/29/2021    CL 105 01/15/2021    CO2 31.0 01/15/2021    BUN 14 01/15/2021    CREATININE 0.78 01/29/2021    GLU 124 01/15/2021    CALCIUM 8.7 01/15/2021    MG 1.5 (L) 01/29/2021    PHOS 2.1 (L) 08/05/2020       Lab Results   Component Value Date    BILITOT 0.2 (L) 01/29/2021    PROT 5.5 (L) 01/15/2021    ALBUMIN 2.9 (L) 01/15/2021    ALT 28 01/15/2021    AST 33 01/15/2021    ALKPHOS 244 (H) 01/15/2021       No results found for: PT, INR, APTT         Erlinda Hong, PA

## 2021-01-29 NOTE — Unmapped (Signed)
Fact Sheet for Patients, Parents And Caregivers Emergency Use Authorization (EUA) of EVUSHELD??? (tixagevimab co-packaged with cilgavimab) for Coronavirus Disease 2019 (COVID-19)      You are being given this Fact Sheet because your healthcare provider believes it is necessary to provide you with EVUSHELD (tixagevimab co-packaged with cilgavimab) for pre-exposure prophylaxis for prevention of coronavirus disease 2019 (COVID-19) caused by the SARS-CoV-2 virus.    This Fact Sheet contains information to help you understand the potential risks and potential benefits of taking EVUSHELD, which you have received or may receive.    The U.S. Food and Drug Administration (FDA) has issued an Emergency Use Authorization (EUA) to make EVUSHELD available during the COVID-19 pandemic (for more details about an EUA please see ???What is an Emergency Use Authorization???? at the end of this document). EVUSHELD is not an FDA-approved medicine in the [place].    Read this Fact Sheet for information about EVUSHELD. Talk to your healthcare provider if you have any questions. It is your choice to receive or not receive EVUSHELD.    What is COVID-19?   COVID-19 is caused by a virus called a coronavirus. You can get COVID-19 through close contact with another person who has the virus.    COVID-19 illnesses have ranged from very mild (including some with no reported symptoms) to severe, including illness resulting in death. While information so far suggests that most COVID-19 illness is mild, serious illness can happen and may cause some of your other medical conditions to become worse. Older people and people of all ages with severe, long-lasting (chronic) medical conditions like heart disease, lung disease, and diabetes, for example, seem to be at higher risk of being hospitalized for COVID-19.    What is EVUSHELD (tixagevimab co-packaged with cilgavimab)?  EVUSHELD is an investigational medicine used in adults and adolescents (60 years of age and older who weigh at least 88 pounds [40 kg]) for preexposure prophylaxis for prevention of COVID-19 in persons who are:  ??? not currently infected with SARS-CoV-2 and who have not had recent known close contact with someone who is infected with SARS-CoV-2 and  o Who have moderate to severe immune compromise due to a medical condition or have received immunosuppressive medicines or treatments and may not mount an adequate immune response to COVID-19 vaccination or  o For whom vaccination with any available COVID-19 vaccine, according to the approved or authorized schedule, is not recommended due to a history of severe adverse reaction (such as severe allergic reaction) to a COVID-19 vaccine(s) or COVID-19 vaccine ingredient(s).    EVUSHELD is investigational because it is still being studied. There is limited information known about the safety and effectiveness of using EVUSHELD for pre-exposure prophylaxis for prevention of COVID-19. EVUSHELD is not authorized for post-exposure prophylaxis for prevention of COVID-19.    The FDA has authorized the emergency use of EVUSHELD for pre-exposure prophylaxis for prevention of COVID-19 under an Emergency Use Authorization (EUA).    What should I tell my healthcare provider before I receive EVUSHELD? Tell your healthcare provider if you:  ??? Have any allergies   ??? Have low numbers of blood platelets (which help blood clotting), a bleeding disorder, or are taking anticoagulants (to prevent blood clots)  ??? Have had a heart attack or stroke, have other heart problems, or are at highrisk of cardiac (heart) events  ??? Are pregnant or plan to become pregnant  ??? Are breastfeeding a child  ??? Have any serious illness  ??? Are  taking any medications (prescription, over-the-counter, vitamins, or herbal products)    How will I receive EVUSHELD?  ??? EVUSHELD consists of two investigational medicines, tixagevimab and cilgavimab.  ??? You will receive 1 dose of EVUSHELD, consisting of 2 separate injections (tixagevimab and cilgavimab).  ??? EVUSHELD will be given to you by your healthcare provider as 2 intramuscular injections. They are usually, given one after the other, 1 into each of your buttocks.    After the initial dose, if your healthcare provider determines that you need to receive additional doses of EVUSHELD for ongoing protection, the additional doses would be administered once every 6 months.    Who should generally not take EVUSHELD?  Do not take EVUSHELD if you have had a severe allergic reaction to EVUSHELD or any ingredient in EVUSHELD.    What are the important possible side effects of EVUSHELD?  Possible side effects of EVUSHELD are:   ??? Allergic reactions. Allergic reactions can happen during and after injection of EVUSHELD. Tell your healthcare provider right away if you get any of the following signs and symptoms of allergic reactions: fever, chills, nausea, headache, shortness of breath, low or high blood pressure, rapid or slow heart rate, chest discomfort or pain, weakness, confusion, feeling tired, wheezing, swelling of your lips, face, or throat, rash including hives, itching, muscle aches, dizziness and sweating. These reactions may be severe or life threatening.  ??? Cardiac (heart) events: Serious cardiac adverse events have happened, but were not common, in people who received EVUSHELD and also in people who did not receive EVUSHELD in the clinical trial studying pre-exposure prophylaxis for prevention of COVID-19. In people with risk factors for cardiac events (including a history of heart attack), more people who received EVUSHELD experienced serious cardiac events than people who did not receive EVUSHELD. It is not known if these events are related to [facility] or underlying medical conditions. Contact your healthcare provider or get medical help right away if you get any symptoms of cardiac events, including pain, pressure, or discomfort in the chest, arms, neck, back, stomach or jaw, as well as shortness of breath, feeling tired or weak (fatigue), feeling sick (nausea), or swelling in your ankles or lower legs.    The side effects of getting any medicine by intramuscular injection may include pain, bruising of the skin, soreness, swelling, and possible bleeding or infection at the injection site.    These are not all the possible side effects of EVUSHELD. Not a lot of people have been given EVUSHELD. Serious and unexpected side effects may happen. EVUSHELD is still being studied so it is possible that all of the risks are not known at this time.    It is possible that EVUSHELD may reduce your body???s immune response to a COVID-19 vaccine. If you have received a COVID-19 vaccine, you should wait to receive EVUSHELD until at least 2 [place] after COVID-19 vaccination.    What other prevention choices are there?  Vaccines to prevent COVID-19 are approved or available under Emergency Use Authorization. Use of EVUSHELD does not replace vaccination against COVID-19. For more information about other medicines authorized for treatment or prevention of COVID-19 go to ThenWeb.com.ee for more information.    It is your choice to receive or not receive EVUSHELD. Should you decide not to receive EVUSHELD, it will not change your standard medical care.    EVUSHELD is not authorized for post-exposure prophylaxis of COVID-19.    What if I am pregnant or breastfeeding?  If you are pregnant or breastfeeding, discuss your options and specific situation with your healthcare provider.     How do I report side effects with EVUSHELD?  Contact your healthcare provider if you have any side effects that bother you or do not go away. Report side effects to FDA MedWatch at www.fda.gov/medwatch or call 1-800-FDA-1088 or call [employer] at 1-800-236-9933.    Additional Information  If you have questions, visit the website or call the telephone number provided below.    To access the most recent EVUSHELD Fact Sheets, please scan the QR code provided below.  Website Telephone number   [email or web address]       [phone]     How can I learn more about COVID-19?  ??? Ask your healthcare provider.  ??? Visit https://www.cdc.gov/COVID19  ??? Contact your local or state public health department.    What is an Emergency Use Authorization?  The [place] FDA has made EVUSHELD (tixagevimab co-packaged with cilgavimab) available under an emergency access mechanism called an Emergency Use Authorization EUA. The EUA is supported by a Secretary of Health and Human Service (HHS) declaration that circumstances exist to justify the emergency use of drugs and biological products during the COVID-19 pandemic.    EVUSHELD for pre-exposure prophylaxis for prevention of coronavirus disease 2019 (COVID-19) caused by the SARS-CoV-2 virus has not undergone the same type of review as an FDA-approved product. In issuing an EUA under the COVID-19 public health emergency, the FDA has determined, among other things, that based on the total amount of scientific evidence available including data from adequate and wellcontrolled clinical trials, if available, it is reasonable to believe that the product may be effective for diagnosing, treating, or preventing COVID-19, or a serious or lifethreatening disease or condition caused by COVID-19; that the known and potential benefits of the product, when used to diagnose, treat, or prevent such disease or condition, outweigh the known and potential risks of such product; and that there are no adequate, approved and available alternatives.    All of these criteria must be met to allow for the product to be used in the treatment of patients during the COVID-19 pandemic. The EUA for EVUSHELD is in effect for the duration of the COVID-19 declaration justifying emergency use of EVUSHELD, unless terminated or revoked (after which EVUSHELD may no longer be used under the EUA).      Distributed by: [employer], [place] [address]    Manufactured by: [employer], [address], [place], [place] [address], [place]    ??[employer] 2021. All rights reserved.        Frequently Asked Questions on the Emergency Use Authorization for Evusheld (tixagevimab co-packaged with cilgavimab) for Pre-exposure Prophylaxis (PrEP) of COVID-19    Q: What is an Emergency Use Authorization (EUA)?  A: Under section 564 of the Federal Food, Drug & Cosmetic Act, after a declaration by the HHS Secretary based on one of four types of determinations, FDA may authorize an unapproved product or unapproved uses of an approved product for emergency use. In issuing an EUA, FDA must determine, among other things, that based on the totality of scientific evidence available to the Agency, including data from adequate and well-controlled clinical trials, if available, it is reasonable to believe that the product may be effective in diagnosing, treating, or preventing a serious or life-threatening disease or condition caused by a chemical, biological, radiological, or nuclear agent; that the known and potential benefits, when used to treat, diagnose   or prevent such disease or condition, outweigh the known and potential risks for the product; and that there are no adequate, approved, and available alternatives. Emergency use authorization is NOT the same as FDA approval or licensure.    Q: What does this EUA authorize? What are the limitations of authorized use?  A: The EUA authorizes [employer]'s Evusheld (tixagevimab co-packaged with cilgavimab) for emergency use as pre-exposure prophylaxis for prevention of COVID-19 in adults and pediatric individuals (12 years of age and older weighing at least 40 kg):    ? Who are not currently infected with SARS-CoV-2 and who have not had a known recent exposure to an individual infected with SARS-CoV-2 and  o Who have moderate to severe immune compromise due to a medical condition or receipt of immunosuppressive medications or treatments and may not mount an adequate immune response to COVID-19 vaccination or  o For whom vaccination with any available COVID-19 vaccine, according to the approved or authorized schedule, is not recommended due to a history of severe adverse reaction (e.g., severe allergic reaction) to a COVID-19 vaccine(s) and/or COVID-19 vaccine component(s).    Limitations of Authorized Use    ? Evusheld is not authorized for use in individuals:  o For treatment of COVID-19, or  o For post-exposure prophylaxis of COVID-19 in individuals who have been exposed to someone infected with SARS-CoV-2.  ? Pre-exposure prophylaxis with Evusheld is not a substitute for vaccination in individuals for whom COVID-19 vaccination is recommended. Individuals for whom COVID-19 vaccination is recommended, including individuals with moderate to severe immune compromise who may derive benefit from COVID-19 vaccination, should receive COVID-19 vaccination.  ? In individuals who have received a COVID-19 vaccine, Evusheld should be administered at least two weeks after vaccination.    Q: What are some medical conditions or treatments that may lead to an inadequate immune response to the COVID-19 vaccination?  A: Medical conditions or treatments that may result in moderate to severe immunocompromise and an inadequate immune response to COVID-19 vaccination include but are not limited to:  ??? Active treatment for solid tumor and hematologic malignancies  ??? Receipt of solid-organ transplant and taking immunosuppressive therapy  ??? Receipt of chimeric antigen receptor (CAR)-T-cell or hematopoietic stem cell transplant (within 2 years of transplantation or taking immunosuppression therapy)  ??? Moderate or severe primary immunodeficiency (e.g., DiGeorge syndrome, Wiskott-Aldrich syndrome)  ??? Moderate or severe primary immunodeficiency (e.g., DiGeorge syndrome, Wiskott-Aldrich syndrome)  ??? Active treatment with high-dose corticosteroids (i.e., ?20 mg prednisone or equivalent per day when administered for ?2 weeks), alkylating agents, antimetabolites, transplant-related immunosuppressive drugs, cancer chemotherapeutic agents classified as severely immunosuppressive, tumor-necrosis (TNF) blockers, and other biologic agents that are immunosuppressive or immunomodulatory (e.g., B-cell depleting agents)    For additional information, refer to the [facility] Vaccines & Immunizations website.    Q: Is Evusheld approved by the FDA to prevent or treat COVID-19?  A: No. Evusheld is not FDA-approved to prevent or treat any diseases or conditions, including COVID-19. Evusheld is an investigational drug.     Q: How can Evusheld be obtained for use under the EUA?  A: For questions on how to obtain Evusheld, please contact [email or web address] .gov.     Q: Are tixagevimab and cilgavimab monoclonal antibodies? What is a monoclonal antibody?   A: Yes, tixagevimab and cilgavimab are monoclonal antibodies. Monoclonal antibodies are laboratory-produced molecules engineered to serve as substitute antibodies that can restore, enhance or mimic the immune system's attack on pathogens. Evusheld is designed to block viral attachment  and entry into human cells, thus neutralizing the virus.     Q: When should Evusheld be administered to a patient?  A: Patients should talk to their healthcare provider to determine whether, based on their individual circumstances, they are eligible to receive Evusheld, and when it should be administered.    More information about administration is available in the Fact Sheet for Health Care Providers.    Q: Are there potential side effects of Evusheld?  A: Possible side effects of Evusheld include the following:     Allergic reactions can happen during and after injection of Evusheld. Reactions to Evusheld may include difficulty breathing or swallowing; shortness of breath; wheezing; swelling of the face, lips, tongue or throat; rash including hives; or itching.    The side effects of getting any medicine by intramuscular injection may include pain, bruising of the skin, soreness, swelling, and possible bleeding or infection at the injection site.     Serious cardiac adverse events (such as myocardial infarction and heart failure) were infrequent in the clinical trial evaluating Evusheld for pre-exposure prophylaxis for prevention. However, more trial participants had serious cardiac adverse events after receiving Evusheld compared to placebo. These participants all had risk factors for cardiac disease or a history of cardiovascular disease before participating in the clinical trial. It is not clear if Evusheld caused these cardiac adverse events.    These are not all the possible side effects of Evusheld. Not a lot of people have been given Evusheld. Serious and unexpected side effects may happen. Evusheld is still being studied so it is possible that all of the risks are not known at this time.    Q: Are there reporting requirements for health care facilities and providers as part of the EUA?  A: Yes. As part of the EUA, FDA requires health care providers who prescribe Evusheld to report all medication errors and serious adverse events considered to be potentially related to Evusheld through FDA's MedWatch Adverse Event Reporting program. Providers can complete and submit the report online; or download and complete the form, then submit it via fax at 1-800-FDA-0178. This requirement is outlined in the EUA's health care provider Fact Sheet. FDA MedWatch forms should also be provided to [employer].    Health care facilities and providers must report therapeutics information and utilization data as directed by the U.S. Department of Health and Human Services. Such information and data should be reported through HHS Protect, Teletracking, or National Health care Safety Network (NHSN).    Q: Do patient outcomes need to be reported under the EUA?  A: No, reporting of patient outcomes is not required under the EUA. However, reporting of all medication errors and serious adverse events considered to be potentially related to Evusheld occurring during treatment is required.    Q: Can health care providers share the patient/caregiver Fact Sheet electronically?  A: The letter of authorization for Evusheld, requires that Fact Sheets be made available to health care providers and to patients/caregivers ???through appropriate means.??? Electronic delivery of the patient/caregiver Fact Sheet is an appropriate means. For example, when the patient requests the Fact Sheet electronically, it can be delivered as a PDF prior to medication administration. Health care providers should confirm receipt of the Fact Sheet with the patient.     Q: Can I receive Evusheld if I recently received a COVID-19 vaccine?  A: Evusheld may reduce your body's immune response to a COVID-19 vaccine. If you receive a COVID-19 vaccine, you should wait to   receive Evusheld until at least two weeks after your COVID-19 vaccination.     Q: Are there data showing Evusheld may provide benefit for pre-exposure prophylaxis for prevention of COVID-19 in certain patients?  A: Yes. The most important scientific evidence supporting the authorization of Evusheld is from [employer], a randomized, double-blind, placebo-controlled clinical trial in adults who had not received a COVID-19 vaccine and did not have a history of SARS-CoV-2 infection or test positive for SARS-CoV-2 infection at the start of the trial. All trial participants were either ?61 years of age, had a pre-specified co-morbidity (obesity, congestive heart failure, chronic obstructive pulmonary disease, chronic kidney disease, chronic liver disease, immunocompromised state, or previous history of severe or serious adverse event after receiving any approved vaccine), or were at increased risk of SARS-CoV-2 infection due to their living situation or occupation.  The main outcome measured in the trial was whether the trial participant had a case of documented COVID-19 after receiving Evusheld or placebo and before day 183 of the trial. In this trial, 3,441 people received Evusheld and 1,731 received a placebo. In the primary analysis, Evusheld recipients saw a 77% reduced risk of developing COVID-19 compared to those who received a placebo, a statistically significant difference. In additional analyses, the reduction in risk of developing COVID-19 was maintained for Evusheld recipients through six months. The safety and effectiveness of this investigational therapy for use in the pre-exposure prevention of COVID-19 continue to be evaluated.  Details on the clinical trial results can be found in Section 14 of the authorized Fact Sheet for Health Care Providers.    Last Updated: 12/08/2021Patient Education      Patient Education        gabapentin  Pronunciation:  GA ba PEN tin  Brand:  Gralise, Horizant, Neurontin  What is the most important information I should know about gabapentin?  Gabapentin can cause life-threatening breathing problems, especially if you already have a breathing disorder or if you use other medicines that can make you drowsy or slow your breathing. Seek emergency medical attention if you have very slow breathing.  Some people have thoughts about suicide or behavior changes while taking gabapentin. Stay alert to changes in your mood or symptoms. Report any new or worsening symptoms to your doctor.  Do not stop using gabapentin suddenly, even if you feel fine.  What is gabapentin?  Gabapentin is used together with other medicines to treat partial seizures in adults and children at least 80 years old.  Gabapentin is also used to treat nerve pain caused by herpes virus or shingles (herpes zoster) in adults.  Use only the brand and form of gabapentin your doctor has prescribed.  Check your medicine each time you get a refill to make sure you receive the correct form.  Gralise is used only to treat nerve pain.  Horizant is used to treat nerve pain and restless legs syndrome (RLS).  Neurontin is used to treat nerve pain and seizures.  Gabapentin may also be used for purposes not listed in this medication guide.  What should I discuss with my healthcare provider before taking gabapentin?  You should not use gabapentin if you are allergic to it.  Tell your doctor if you have ever had:  ?? breathing problems or lung disease, such as chronic obstructive pulmonary disease (COPD);  ?? kidney disease (or if you are on dialysis);  ?? diabetes;  ?? depression, a mood disorder, or suicidal thoughts or actions;  ?? a drug addiction;  ??  a seizure (unless you take gabapentin to treat seizures);  ?? liver disease;  ?? heart disease; or  ?? (for patients with RLS) if you are a day sleeper or work a night shift.  Some people have thoughts about suicide while taking this medicine. Children taking gabapentin may have behavior changes. Stay alert to changes in your mood or symptoms. Report any new or worsening symptoms to your doctor.  It is not known whether this medicine will harm an unborn baby. Tell your doctor if you are pregnant or plan to become pregnant.  Seizure control is very important during pregnancy, and having a seizure could harm both mother and baby. Do not start or stop taking gabapentin for seizures without your doctor's advice, and tell your doctor right away if you become pregnant.  If you are pregnant, your name may be listed on a pregnancy registry to track the effects of gabapentin on the baby.  It may not be safe to breastfeed while using this medicine.  Ask your doctor about any risk.  How should I take gabapentin?  Follow all directions on your prescription label. Do not take this medicine in larger or smaller amounts or for longer than recommended.  If your doctor changes your brand, strength, or type of gabapentin, your dosage needs may change.  Ask your pharmacist if you have any questions about the new kind of gabapentin you receive at the pharmacy.  Both Gralise and Horizant should be taken with food.  Neurontin can be taken with or without food.  If you break a Neurontin tablet and take only half of it, take the other half at your next dose. Any tablet that has been broken should be used as soon as possible or within a few days.  Swallow the capsule  or tablet  whole and do not crush, chew, break, or open it.  Measure liquid medicine carefully. Use the dosing syringe provided, or use a medicine dose-measuring device (not a kitchen spoon).  Do not stop using gabapentin suddenly, even if you feel fine. Stopping suddenly may cause increased seizures. Follow your doctor's instructions about tapering your dose.  In case of emergency, wear or carry medical identification to let others know you have seizures.  This medicine can cause unusual results with certain medical tests. Tell any doctor who treats you that you are using gabapentin.  Store gabapentin tablets and capsules at room temperature away from light and moisture.  Store the liquid medicine in the refrigerator. Do not freeze.  What happens if I miss a dose?  Take the medicine as soon as you can, but skip the missed dose if it is almost time for your next dose. Do not take two doses at one time.  If you take Horizant:  Skip the missed dose and use your next dose at the regular time. Do not use two doses of Horizant at one time.  What happens if I overdose?  Seek emergency medical attention or call the Poison Help line at [phone].  What should I avoid while taking gabapentin?  Avoid driving or hazardous activity until you know how this medicine will affect you. Your reactions could be impaired. Dizziness or drowsiness can cause falls, accidents, or severe injuries.  Avoid taking an antacid within 2 hours before you take gabapentin. Antacids can make it harder for your body to absorb gabapentin.  Avoid drinking alcohol while taking gabapentin.  What are the possible side effects of gabapentin?  Get emergency medical help  if you have signs of an allergic reaction: hives; difficult breathing; swelling of your face, lips, tongue, or throat.  Seek medical treatment if you have a serious drug reaction that can affect many parts of your body. Symptoms may include: skin rash, fever, swollen glands, muscle aches, severe weakness, unusual bruising, upper stomach pain, or yellowing of your skin or eyes.  Report any new or worsening symptoms to your doctor, such as: mood or behavior changes, anxiety, panic attacks, trouble sleeping, or if you feel impulsive, irritable, agitated, hostile, aggressive, restless, hyperactive (mentally or physically), depressed, or have thoughts about suicide or hurting yourself.  Call your doctor at once if you have:  ?? weak or shallow breathing;  ?? blue-colored skin, lips, fingers, and toes;  ?? confusion, extreme drowsiness or weakness;  ?? problems with balance or muscle movement;  ?? unusual or involuntary eye movements; or  ?? increased seizures.  Gabapentin can cause life-threatening breathing problems. A person caring for you should seek emergency medical attention if you have slow breathing with long pauses, blue colored lips, or if you are hard to wake up. Breathing problems may be more likely in older adults or in people with COPD.  Some side effects are more likely in children taking gabapentin. Contact your doctor if the child taking this medicine has any of the following side effects:  ?? changes in behavior;  ?? memory problems;  ?? trouble concentrating; or  ?? acting restless, hostile, or aggressive.  Common side effects may include:  ?? fever, chills, sore throat, body aches, unusual tiredness;  ?? jerky movements;  ?? headache;  ?? double vision;  ?? swelling of your legs and feet;  ?? tremors;  ?? trouble speaking;  ?? dizziness, drowsiness, tiredness;  ?? problems with balance or eye movements; or  ?? nausea, vomiting.  This is not a complete list of side effects and others may occur. Call your doctor for medical advice about side effects. You may report side effects to FDA at 1-800-FDA-1088.  What other drugs will affect gabapentin?  Using gabapentin with other drugs that make you drowsy or slow your breathing can cause dangerous side effects or death. Ask your doctor before using opioid medication, a sleeping pill, cold or allergy medicine, a muscle relaxer, or medicine for anxiety or seizures.  Other drugs may affect gabapentin, including prescription and over-the-counter medicines, vitamins, and herbal products. Tell your doctor about all your current medicines and any medicine you start or stop using.  Where can I get more information?  Your pharmacist can provide more information about gabapentin.  Remember, keep this and all other medicines out of the reach of children, never share your medicines with others, and use this medication only for the indication prescribed.   Every effort has been made to ensure that the information provided by [employer] ('[employer]') is accurate, up-to-date, and complete, but no guarantee is made to that effect. Drug information contained herein may be time sensitive. [employer] information has been compiled for use by healthcare practitioners and consumers in the [place] and therefore [employer] does not warrant that uses outside of the [place] are appropriate, unless specifically indicated otherwise. [employer]'s drug information does not endorse drugs, diagnose patients or recommend therapy. [employer]'s drug information is an Armed forces technical officer designed to assist licensed healthcare practitioners in caring for their patients and/or to serve consumers viewing this service as a supplement to, and not a substitute for, the expertise, skill, knowledge and judgment of  healthcare practitioners. The absence of a warning for a given drug or drug combination in no way should be construed to indicate that the drug or drug combination is safe, effective or appropriate for any given patient. Mult[employer] does not assume any responsibility for any aspect of healthcare administered with the aid of information Mult[employer] provides. The information contained herein is not intended to cover all possible uses, directions, precautions, warnings, drug interactions, allergic reactions, or adverse effects. If you have questions about the drugs you are taking, check with your doctor, nurse or pharmacist.  Copyright [phone] [employer] Version: 17.01. Revision date: 12/17/2019.  Care instructions adapted under license by [facility]. If you have questions about a medical condition or this instruction, always ask your healthcare professional. [employer] disclaims any warranty or liability for your use of this information.

## 2021-01-29 NOTE — Unmapped (Unsigned)
Port accessed and labs drawn with no complications by [facility]  Port flushed with saline .

## 2021-01-29 NOTE — Unmapped (Signed)
[facility] Refill Coordination Note    Specialty Medication(s) to be Shipped:   Hematology/Oncology: Ziextenzo 6 mg/ 0.6 mL    Other medication(s) to be shipped: No additional medications requested for fill at this time     Manuel White, DOB: [birth year]  Phone: [phone] (home)       All above HIPAA information was verified with patient.     Was a Nurse, learning disability used for this call? No    Completed refill call assessment today to schedule patient's medication shipment from the [facility] [phone]).       Specialty medication(s) and dose(s) confirmed: Regimen is correct and unchanged.   Changes to medications: Manuel White reports no changes at this time.  Changes to insurance: No  Questions for the pharmacist: No    Confirmed patient received Welcome Packet with first shipment. The patient will receive a drug information handout for each medication shipped and additional FDA Medication Guides as required.       DISEASE/MEDICATION-SPECIFIC INFORMATION        For patients on injectable medications: Patient currently has 1 doses left.  Next injection is scheduled for 02/01/21.    SPECIALTY MEDICATION ADHERENCE     Medication Adherence    Patient reported X missed doses in the last month: 0  Specialty Medication: Ziextenzo 6 mg/ 0.6 mL  Patient is on additional specialty medications: No  Informant: patient                Ziextenzo 6 mg/ 0.6 mL: 1 days of medicine on hand         SHIPPING     Shipping address confirmed in Epic.     Delivery Scheduled: Yes, Expected medication delivery date: 02/10/21.  However, Rx request for refills was sent to the provider as there are none remaining.     Medication will be delivered via UPS to the prescription address in Epic [place].    Manuel White Manuel White   [facility] Specialty Technician

## 2021-01-29 NOTE — Unmapped (Signed)
Service:  Hematology and Oncology  Patient Name: Manuel White  Patient Age: 60 y.o.  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 Large bowel obstruction (rectosigmoid obstructing mass), s/p loop diverting ostomy 08-03-20  3. Weight loss, stabilized and improving  4. Hearing loss reported with C5  5. Normocytic anemia  6. A fib with one episode SVT inpatient, no recurrence  7. Grade 1 neuropathy    - he is concerned about financial toxicity related to treatment; recommend ongoing and close follow up with social work and Physiological scientist  - he has deferred audiology referral due to cost  - colonoscopy; he would like to defer until next scans due to cost. He understands rationale.  - C 10 due today; discontinue oxaliplatin, de-escalate to 5-FU/LV/avastin after 12 cycles  - Zometa, calcium vit D, discuss at next visit  - imaging due first week April      _____________________________________________________________________   ONCOLOGY HISTORY  Diagnosed age 34 (Sept 2021) with metastatic colorectal cancer.  PMH is notable for kidney stones and A fib.  Reports intermittent diarrhea and abdominal pain since March 2021. CT 07-29-20 with colon mass.  He presented to [facility] the weekend prior to planned palliative colonic stent for large bowel obstruction, and underwent flex sig with diverting loop colonoscopy 08-03-20.  Biopsy consistent with moderately differentiated adenocarcinoma (MSS). Staging CT ([facility]) notable for liver, lung and possible L iliac met.    INTERVAL HISTORY:  Accompanied by his wife. Peripheral neuropathy in hands is present most of the day, but is not limiting ADLs.  Denies: fevers, chills, CP, SOB, cough, abdominal pain, emesis, change in ostomy output, blood through ostomy output.   ??  A 10 point ROS was performed and negative except as noted above in the HPI.    _____________________________________________  OTHER PAST MEDICAL HISTORY / CURRENT PROBLEM LIST    Afib/SVT  HTN  MDD/Anxiety  BPH    Past Medical History:   Diagnosis Date   ??? BPH (benign prostatic hyperplasia)    ??? Colon cancer (CMS-HCC)    ??? Hypertension       _____________________________________________________________________  ALLERGIES    Patient has no known allergies.     _____________________________________________________________________  CURRENT MEDICATIONS    I have changed Manuel White's tamsulosin. I am also having him start on magnesium oxide and gabapentin. Additionally, I am having him maintain his citalopram, docusate sodium, LORazepam, metoprolol succinate, fluticasone propionate, oxyCODONE, acetaminophen, multivitamin, loperamide, (heparin, porcine (PF)), sodium chloride, HYDROcodone-acetaminophen, SYSTANE BALANCE, ondansetron, promethazine, loratadine, polyethylene glycol, and dexAMETHasone.     _____________________________________________________________________  HABITS    SOCIAL HISTORY  Social History  Socioeconomic History   ??? Marital status: Married     Spouse name: Not on file   ??? Number of children: Not on file   ??? Years of education: Not on file   ??? Highest education level: Not on file   Occupational History   ??? Not on file   Tobacco Use   ??? Smoking status: Never Smoker   ??? Smokeless tobacco: Never Used   Vaping Use   ??? Vaping Use: Never used   Substance and Sexual Activity   ??? Alcohol use: Never   ??? Drug use: Never   ??? Sexual activity: Not Currently   Other Topics Concern   ??? Not on file   Social History Narrative   ??? Not on file     Social Determinants of Health     Financial Resource Strain: High Risk   ??? Difficulty of Paying Living Expenses: Hard   Food Insecurity: No Food Insecurity   ??? Worried About Programme researcher, broadcasting/film/video in the Last Year: Never true   ??? Ran Out of Food in the Last Year: Never true   Transportation Needs: No Transportation Needs   ??? Lack of Transportation (Medical): No   ??? Lack of Transportation (Non-Medical): No   Physical Activity: Not on file   Stress: Not on file   Social Connections: Not on file         FAMILY HISTORY  Mother- Uterine, Breast Ca, deceased     _____________________________________________________________________  PHYSICAL EXAM    VITAL SIGNS: Pulse 81  - Temp 36.4 ??C (97.5 ??F) (Oral)  - Resp 16  - Wt 61.7 kg (136 lb)  - SpO2 100%  - BMI 18.97 kg/m??    Wt Readings from Last 3 Encounters:   02/12/21 61.2 kg (134 lb 14.4 oz)   01/29/21 61.7 kg (136 lb 0.4 oz)   01/29/21 61.7 kg (136 lb)   Gen: Thin man, sitting comfortably on couch, NAD  HEENT: PERRL, non-icteric sclera, no cervical or supraclavicular LAD  CV: RRR, no M/R/G, no LE edema  Pulm: CTAB  AB: +BS, soft, ND, ND, ostomy in place with normal output  Skin: no rash, lesions  Psych: normal affect,    _____________________________________________________________________  LABS    Lab on 01/29/2021   Component Date Value   ??? Creatinine 01/29/2021 0.78    ??? EGFR CKD-EPI Non-African* 01/29/2021 >90    ??? EGFR CKD-EPI African Ame* 01/29/2021 >90    ??? Total Bilirubin 01/29/2021 0.2 (A)   ??? Potassium 01/29/2021 3.7    ??? Magnesium 01/29/2021 1.5 (A)   ??? WBC 01/29/2021 10.5    ??? RBC 01/29/2021 3.32 (A)   ??? HGB 01/29/2021 9.4 (A)   ??? HCT 01/29/2021 28.0 (A)   ??? MCV 01/29/2021 84.6    ??? [facility] 01/29/2021 28.3    ??? MCHC 01/29/2021 33.5    ??? RDW 01/29/2021 20.3 (A)   ??? MPV 01/29/2021 7.3    ??? Platelet 01/29/2021 228    ??? Neutrophils % 01/29/2021 78.6    ??? Lymphocytes % 01/29/2021 12.7    ??? Monocytes % 01/29/2021 7.7    ??? Eosinophils % 01/29/2021 0.7    ??? Basophils % 01/29/2021 0.3    ??? Absolute Neutrophils 01/29/2021 8.3 (A)   ??? Absolute Lymphocytes 01/29/2021 1.3    ??? Absolute Monocytes 01/29/2021 0.8    ??? Absolute Eosinophils 01/29/2021 0.1    ??? Absolute Basophils 01/29/2021 0.0    ??? Anisocytosis 01/29/2021 Moderate (  A)   ??? Spec Gravity/POC 01/29/2021 >=1.030    ??? PH/POC 01/29/2021 5.5    ??? Leuk Esterase/POC 01/29/2021 Negative    ??? Nitrite/POC 01/29/2021 Negative    ??? Protein/POC 01/29/2021 Negative    ??? UA Glucose/POC 01/29/2021 Negative    ??? Ketones, POC 01/29/2021 Negative    ??? Bilirubin/POC 01/29/2021 Negative    ??? Blood/POC 01/29/2021 Negative    ??? Urobilinogen/POC 01/29/2021 0.2    ??? Smear Review Comments 01/29/2021 See Comment (A)   ??? Dohle Bodies 01/29/2021 Present (A)   ??? Toxic Granulation 01/29/2021 Present (A)   ??? Toxic Vacuolation 01/29/2021 Present (A)   ??? Neutrophil Left Shift 01/29/2021 Present (A)       RADIOLOGY / OTHER DIAGNOSTIC STUDIES  Flexible sigmoidoscopy 08-03-20:  Findings:       Hemorrhoids were found on perianal exam.       A fungating and completely obstructing large mass was found at 16 cm        proximal to the anus. No bleeding was present. Biopsies were taken with        a cold forceps for histology.                                                                                   Impression:            - Hemorrhoids found on perianal exam.                         - Likely malignant completely obstructing tumor at 16                          cm proximal to the anus. Biopsied.    PATHOLOGY  08-03-20:  Addendum   The purpose of this addendum is to report the results of immunohistochemical stains for mismatch repair proteins.    ??  Immunohistochemical stains for MLH1, MSH2, MSH6 and PMS2 were performed on block A1.  The tumor cells demonstrate appropriate nuclear expression for all four markers.  This is the normal phenotype and does NOT support a diagnosis of microsatellite instability/hereditary non-polyposis colorectal cancer (HNPCC).  Separate molecular testing for microsatellite instability markers will be performed by the [employer] (phone # [phone]) and reported separately.    ??  This result may have implications for therapy selection, as MSS/mismatch repair proficient tumors are less likely to respond to immune modulating agents like Pembrolizumab (1).  ??  1) Le DT, et al.?? [employer] J Med. 2015 Jun 25;372(26):2509-20. PMID: 11914782  ??  The original diagnoses are unchanged.  ??  Selected tissue block for possible future molecular pathology studies:  A1  ??  ??   Addendum electronically signed by Lyla Glassing, MD on 08/05/2020 at 1621   Final Diagnosis   A: Colon, rectosigmoid tumor, biopsy  - Invasive moderately differentiated adenocarcinoma associated with scant fragments of a tubulovillous adenoma with high-grade dysplasia  - No lymphovascular space invasion identified  - See comment         Imaging:  CT chest 11-23-20:  1. Numerous  pulmonary nodules, involving all 5 lobes, most of which appear cavitary. Those nodules imaged on the previous CT abdomen demonstrate interval decreased size and increased cavitation since previous.  2. Mildly dilated ascending thoracic aorta.    CT AP 11-23-20:  Focal masslike thickening of the sigmoid colon consistent with known adenocarcinoma is mildly decreased in size.  ??  Interval left lower quadrant diverting loop sigmoid colostomy. Large colonic stool burden with no evidence of obstruction.  ??  Numerous hepatic metastasis are overall decreased in size and enhancement suggestive of treatment response.  ??  Stable partially calcified retroperitoneal lymphadenopathy.  ??  Unchanged mixed lytic and sclerotic left iliac lesion.  ??  No evidence of new metastatic disease within the abdomen/pelvis.  ??    Bone scan 09-18-20:  Slight increased uptake in the medial aspect of the of the left iliac bone corresponding to the lucency surrounded by sclerotic change seen on CT dated 07/29/20.

## 2021-01-29 NOTE — Unmapped (Signed)
Outpatient Oncology Social Work  Follow Up       Psychosocial & Case Management Update:  SW met with Pt and his wife in infusion. Pt's [employer] application was denied due to missing documents including a statement from the bank overdraft account as well his wife's most recent Administrator, Civil Service. The SS letter provided was dated 2020. SW explained that since the overdraft account is not a separate bank account and Pt confirms there is no money in that account, Pt will need a letter from the bank confirming. SW also explained that Pt's wife can request an updated letter by calling SS or by setting up an online account. Pt reported understanding and requested SW send him an email of the documents needed. SW also asked that Pt obtain a copy of his March bank statements.    Pt reported no other questions at this time.    Follow-Up Plan:   Pt has this SW's contact information and will reach out for assistance as needed.      Annice Pih, LCSW  Oncology Outpatient Social Worker  [phone]

## 2021-01-30 NOTE — Unmapped (Signed)
Pt completed chemo infusions without complications today.  Pt was stable at discharge via self ambulation with wife.

## 2021-01-30 NOTE — Unmapped (Signed)
Lab on 01/29/2021   Component Date Value Ref Range Status   ??? Creatinine 01/29/2021 0.78  0.60 - 1.10 mg/dL Final   ??? EGFR CKD-EPI Non-African American,* 01/29/2021 >90  >=60 mL/min/1.22m2 Final   ??? EGFR CKD-EPI African American, Male 01/29/2021 >90  >=60 mL/min/1.37m2 Final   ??? Total Bilirubin 01/29/2021 0.2 (A) 0.3 - 1.2 mg/dL Final   ??? Potassium 63/11/6008 3.7  3.4 - 4.8 mmol/L Final   ??? Magnesium 01/29/2021 1.5 (A) 1.6 - 2.6 mg/dL Final   ??? WBC 93/23/5573 10.5  3.6 - 11.2 10*9/L Final   ??? RBC 01/29/2021 3.32 (A) 4.26 - 5.60 10*12/L Final   ??? HGB 01/29/2021 9.4 (A) 12.9 - 16.5 g/dL Final   ??? HCT 22/12/5425 28.0 (A) 39.0 - 48.0 % Final   ??? MCV 01/29/2021 84.6  77.6 - 95.7 fL Final   ??? MCH 01/29/2021 28.3  25.9 - 32.4 pg Final   ??? MCHC 01/29/2021 33.5  32.0 - 36.0 g/dL Final   ??? RDW 05/14/7627 20.3 (A) 12.2 - 15.2 % Final   ??? MPV 01/29/2021 7.3  6.8 - 10.7 fL Final   ??? Platelet 01/29/2021 228  150 - 450 10*9/L Final   ??? Neutrophils % 01/29/2021 78.6  % Final   ??? Lymphocytes % 01/29/2021 12.7  % Final   ??? Monocytes % 01/29/2021 7.7  % Final   ??? Eosinophils % 01/29/2021 0.7  % Final   ??? Basophils % 01/29/2021 0.3  % Final   ??? Absolute Neutrophils 01/29/2021 8.3 (A) 1.8 - 7.8 10*9/L Final   ??? Absolute Lymphocytes 01/29/2021 1.3  1.1 - 3.6 10*9/L Final   ??? Absolute Monocytes 01/29/2021 0.8  0.3 - 0.8 10*9/L Final   ??? Absolute Eosinophils 01/29/2021 0.1  0.0 - 0.5 10*9/L Final   ??? Absolute Basophils 01/29/2021 0.0  0.0 - 0.1 10*9/L Final   ??? Anisocytosis 01/29/2021 Moderate (A) Not Present Final   ??? Spec Gravity/POC 01/29/2021 >=1.030  1.003 - 1.030 Final   ??? PH/POC 01/29/2021 5.5  5.0 - 9.0 Final   ??? Leuk Esterase/POC 01/29/2021 Negative  Negative Final   ??? Nitrite/POC 01/29/2021 Negative  Negative Final   ??? Protein/POC 01/29/2021 Negative  Negative Final   ??? UA Glucose/POC 01/29/2021 Negative  Negative Final   ??? Ketones, POC 01/29/2021 Negative  Negative Final   ??? Bilirubin/POC 01/29/2021 Negative  Negative Final ??? Blood/POC 01/29/2021 Negative  Negative Final   ??? Urobilinogen/POC 01/29/2021 0.2  0.2 - 1.0 mg/dL Final   ??? Smear Review Comments 01/29/2021 See Comment (A) Undefined Final    Slide reviewed  Left shift present (>15% immature granulocytes)  Myelocytes present.  Promyelocytes present-rare  Irregularly contracted RBCs present.     ??? Dohle Bodies 01/29/2021 Present (A) Not Present Final   ??? Toxic Granulation 01/29/2021 Present (A) Not Present Final   ??? Toxic Vacuolation 01/29/2021 Present (A) Not Present Final   ??? Neutrophil Left Shift 01/29/2021 Present (A) Not Present Final

## 2021-02-01 MED ORDER — ZIEXTENZO 6 MG/0.6 ML SUBCUTANEOUS SYRINGE
3 refills | 0 days | Status: CP
Start: 2021-02-01 — End: ?
  Filled 2021-02-09: qty 1.2, 28d supply, fill #0

## 2021-02-01 NOTE — Unmapped (Signed)
Patient Manuel White was called in attempt number one to reach them regarding their upcoming appointments on 02/12/21 & 02/26/21.     The call resulted in: Left Message    Please inform the patient upon their return call of the following:     Appt times for 02/12/21    Thank you,    [name]

## 2021-02-05 NOTE — Unmapped (Signed)
Outpatient Oncology Social Work  Follow Up       Psychosocial & Case Management Update:  Pt sent SW documents for his [facility]. SW submitted Pt's wife's updated 2022 SSI award info and letter from [facility] indicating that the overdraft account is not a separate bank account to [employer].    Follow-Up Plan:   Pt has this SW's contact information and will reach out for assistance as needed.      Annice Pih, LCSW  Oncology Outpatient Social Worker  [phone]

## 2021-02-10 NOTE — Unmapped (Signed)
Returned call to pt. States he was told her would need a CT scan on 02-26-21 but an order has not been entered.Let him know I will notify his team and will call back with their recommendations.He will call back with any further questions or concerns.

## 2021-02-10 NOTE — Unmapped (Signed)
Wardell Heath contacted the [employer] requesting to speak with the care team of Marios Gaiser to discuss:    Patient states that Dr. Tye Maryland wanted him to have a CT. No orders in chart.    Please contact Mr. Tolan at [phone].      Thank you,   Kelli Hope  [facility] Communication [facility]   [phone]

## 2021-02-11 DIAGNOSIS — C189 Malignant neoplasm of colon, unspecified: Principal | ICD-10-CM

## 2021-02-11 DIAGNOSIS — C787 Secondary malignant neoplasm of liver and intrahepatic bile duct: Principal | ICD-10-CM

## 2021-02-11 NOTE — Unmapped (Signed)
I spoke with patient [place] to confirm appointments on the following date(s): 02/26/21 for 8:00am CT at [facility].      [name]

## 2021-02-12 ENCOUNTER — Encounter: Admit: 2021-02-12 | Discharge: 2021-02-13 | Payer: PRIVATE HEALTH INSURANCE

## 2021-02-12 DIAGNOSIS — C787 Secondary malignant neoplasm of liver and intrahepatic bile duct: Principal | ICD-10-CM

## 2021-02-12 DIAGNOSIS — C189 Malignant neoplasm of colon, unspecified: Principal | ICD-10-CM

## 2021-02-12 LAB — CBC W/ AUTO DIFF
BASOPHILS ABSOLUTE COUNT: 0 10*9/L (ref 0.0–0.1)
BASOPHILS RELATIVE PERCENT: 0.2 %
EOSINOPHILS ABSOLUTE COUNT: 0.1 10*9/L (ref 0.0–0.5)
EOSINOPHILS RELATIVE PERCENT: 1.3 %
HEMATOCRIT: 28.9 % — ABNORMAL LOW (ref 39.0–48.0)
HEMOGLOBIN: 9.7 g/dL — ABNORMAL LOW (ref 12.9–16.5)
LYMPHOCYTES ABSOLUTE COUNT: 1.1 10*9/L (ref 1.1–3.6)
LYMPHOCYTES RELATIVE PERCENT: 12 %
MEAN CORPUSCULAR HEMOGLOBIN CONC: 33.6 g/dL (ref 32.0–36.0)
MEAN CORPUSCULAR HEMOGLOBIN: 29.2 pg (ref 25.9–32.4)
MEAN CORPUSCULAR VOLUME: 86.8 fL (ref 77.6–95.7)
MEAN PLATELET VOLUME: 7.6 fL (ref 6.8–10.7)
MONOCYTES ABSOLUTE COUNT: 0.9 10*9/L — ABNORMAL HIGH (ref 0.3–0.8)
MONOCYTES RELATIVE PERCENT: 9.3 %
NEUTROPHILS ABSOLUTE COUNT: 7.3 10*9/L (ref 1.8–7.8)
NEUTROPHILS RELATIVE PERCENT: 77.2 %
PLATELET COUNT: 234 10*9/L (ref 150–450)
RED BLOOD CELL COUNT: 3.33 10*12/L — ABNORMAL LOW (ref 4.26–5.60)
RED CELL DISTRIBUTION WIDTH: 22.5 % — ABNORMAL HIGH (ref 12.2–15.2)
WBC ADJUSTED: 9.5 10*9/L (ref 3.6–11.2)

## 2021-02-12 LAB — COMPREHENSIVE METABOLIC PANEL
ALBUMIN: 3.2 g/dL — ABNORMAL LOW (ref 3.4–5.0)
ALKALINE PHOSPHATASE: 283 U/L — ABNORMAL HIGH (ref 46–116)
ALT (SGPT): 29 U/L (ref 10–49)
ANION GAP: 5 mmol/L (ref 5–14)
AST (SGOT): 29 U/L (ref <=34)
BILIRUBIN TOTAL: 0.3 mg/dL (ref 0.3–1.2)
BLOOD UREA NITROGEN: 17 mg/dL (ref 9–23)
BUN / CREAT RATIO: 18
CALCIUM: 8.8 mg/dL (ref 8.7–10.4)
CHLORIDE: 106 mmol/L (ref 98–107)
CO2: 31 mmol/L (ref 20.0–31.0)
CREATININE: 0.93 mg/dL
EGFR CKD-EPI AA MALE: >90 mL/min/{1.73_m2} (ref >=60)
EGFR CKD-EPI NON-AA MALE: 89 mL/min/{1.73_m2} (ref >=60)
GLUCOSE RANDOM: 85 mg/dL (ref 70–179)
POTASSIUM: 4 mmol/L (ref 3.5–5.1)
PROTEIN TOTAL: 5.8 g/dL (ref 5.7–8.2)
SODIUM: 142 mmol/L (ref 135–145)

## 2021-02-12 LAB — CEA: CARCINOEMBRYONIC ANTIGEN: 19.8 ng/mL — ABNORMAL HIGH (ref 0.0–5.0)

## 2021-02-12 LAB — SLIDE REVIEW

## 2021-02-12 LAB — MAGNESIUM: MAGNESIUM: 1.8 mg/dL (ref 1.6–2.6)

## 2021-02-12 MED ADMIN — dextrose 5 % infusion: 100 mL/h | INTRAVENOUS | @ 19:00:00 | Stop: 2021-02-12

## 2021-02-12 MED ADMIN — leucovorin 346 mg in dextrose 5 % 50 mL IVPB: 200 mg/m2 | INTRAVENOUS | @ 19:00:00 | Stop: 2021-02-12

## 2021-02-12 MED ADMIN — irinotecan (CAMPTOSAR) 285.4 mg in dextrose 5 % 500 mL IVPB: 165 mg/m2 | INTRAVENOUS | @ 19:00:00 | Stop: 2021-02-12

## 2021-02-12 MED ADMIN — sodium chloride (NS) 0.9 % infusion: 100 mL/h | INTRAVENOUS | @ 19:00:00

## 2021-02-12 MED ADMIN — fosaprepitant (EMEND) 150 mg in sodium chloride (NS) 0.9 % 100 mL IVPB: 150 mg | INTRAVENOUS | @ 19:00:00 | Stop: 2021-02-12

## 2021-02-12 MED ADMIN — bevacizumab-bvzr (ZIRABEV) 297.5 mg in sodium chloride (NS) 0.9 % 100 mL IVPB: 5 mg/kg | INTRAVENOUS | @ 21:00:00 | Stop: 2021-02-12

## 2021-02-12 MED ADMIN — dexAMETHasone (DECADRON) tablet 12 mg: 12 mg | ORAL | @ 19:00:00 | Stop: 2021-02-12

## 2021-02-12 MED ADMIN — ondansetron (ZOFRAN) tablet 24 mg: 24 mg | ORAL | @ 19:00:00 | Stop: 2021-02-12

## 2021-02-12 MED ADMIN — fluorouracil (ADRUCIL) 4,150 mg in sodium chloride (NS) 0.9 % 46-hr infusion CADD: 2400 mg/m2 | INTRAVENOUS | @ 22:00:00

## 2021-02-13 NOTE — Unmapped (Signed)
Tolerated infusion's of chemotherapy and biotherapy without adverse reaction. Discharged home with 5FU infusing via right chest port-a-cath. Pump running at time of discharge and all lines patent. Pt ambulated off unit. Danella Maiers Raymondo Garcialopez,RN

## 2021-02-25 DIAGNOSIS — C787 Secondary malignant neoplasm of liver and intrahepatic bile duct: Principal | ICD-10-CM

## 2021-02-25 DIAGNOSIS — C189 Malignant neoplasm of colon, unspecified: Principal | ICD-10-CM

## 2021-02-26 ENCOUNTER — Encounter: Admit: 2021-02-26 | Discharge: 2021-02-27 | Payer: PRIVATE HEALTH INSURANCE

## 2021-02-26 ENCOUNTER — Ambulatory Visit
Admit: 2021-02-26 | Discharge: 2021-02-27 | Payer: PRIVATE HEALTH INSURANCE | Attending: Internal Medicine | Primary: Internal Medicine

## 2021-02-26 DIAGNOSIS — C189 Malignant neoplasm of colon, unspecified: Principal | ICD-10-CM

## 2021-02-26 DIAGNOSIS — C787 Secondary malignant neoplasm of liver and intrahepatic bile duct: Principal | ICD-10-CM

## 2021-02-26 LAB — CBC W/ AUTO DIFF
BASOPHILS ABSOLUTE COUNT: 0 10*9/L (ref 0.0–0.1)
BASOPHILS RELATIVE PERCENT: 0.4 %
EOSINOPHILS ABSOLUTE COUNT: 0.3 10*9/L (ref 0.0–0.5)
EOSINOPHILS RELATIVE PERCENT: 2.3 %
HEMATOCRIT: 31.7 % — ABNORMAL LOW (ref 39.0–48.0)
HEMOGLOBIN: 10.3 g/dL — ABNORMAL LOW (ref 12.9–16.5)
LYMPHOCYTES ABSOLUTE COUNT: 1.1 10*9/L (ref 1.1–3.6)
LYMPHOCYTES RELATIVE PERCENT: 9.9 %
MEAN CORPUSCULAR HEMOGLOBIN CONC: 32.6 g/dL (ref 32.0–36.0)
MEAN CORPUSCULAR HEMOGLOBIN: 29.2 pg (ref 25.9–32.4)
MEAN CORPUSCULAR VOLUME: 89.7 fL (ref 77.6–95.7)
MEAN PLATELET VOLUME: 7.9 fL (ref 6.8–10.7)
MONOCYTES ABSOLUTE COUNT: 0.8 10*9/L (ref 0.3–0.8)
MONOCYTES RELATIVE PERCENT: 7.2 %
NEUTROPHILS ABSOLUTE COUNT: 9.2 10*9/L — ABNORMAL HIGH (ref 1.8–7.8)
NEUTROPHILS RELATIVE PERCENT: 80.2 %
PLATELET COUNT: 188 10*9/L (ref 150–450)
RED BLOOD CELL COUNT: 3.53 10*12/L — ABNORMAL LOW (ref 4.26–5.60)
RED CELL DISTRIBUTION WIDTH: 23.3 % — ABNORMAL HIGH (ref 12.2–15.2)
WBC ADJUSTED: 11.4 10*9/L — ABNORMAL HIGH (ref 3.6–11.2)

## 2021-02-26 LAB — SLIDE REVIEW

## 2021-02-26 LAB — POTASSIUM: POTASSIUM: 4.1 mmol/L (ref 3.4–4.8)

## 2021-02-26 LAB — CREATININE
CREATININE: 0.79 mg/dL
EGFR CKD-EPI AA MALE: >90 mL/min/{1.73_m2} (ref >=60)
EGFR CKD-EPI NON-AA MALE: >90 mL/min/{1.73_m2} (ref >=60)

## 2021-02-26 LAB — MAGNESIUM: MAGNESIUM: 1.8 mg/dL (ref 1.6–2.6)

## 2021-02-26 LAB — BILIRUBIN, TOTAL: BILIRUBIN TOTAL: 0.3 mg/dL (ref 0.3–1.2)

## 2021-02-26 MED ORDER — GABAPENTIN 300 MG CAPSULE
ORAL_CAPSULE | Freq: Three times a day (TID) | ORAL | 3 refills | 45 days | Status: CP
Start: 2021-02-26 — End: 2022-02-26

## 2021-02-26 MED ADMIN — sodium chloride (NS) 0.9 % infusion: 100 mL/h | INTRAVENOUS | @ 19:00:00

## 2021-02-26 MED ADMIN — iohexol (OMNIPAQUE) 240 mg iodine/mL oral solution 50 mL: 50 mL | ORAL | @ 12:00:00 | Stop: 2021-02-26

## 2021-02-26 MED ADMIN — iohexoL (OMNIPAQUE) 350 mg iodine/mL solution 100 mL: 100 mL | INTRAVENOUS | @ 13:00:00 | Stop: 2021-02-26

## 2021-02-26 MED ADMIN — dexAMETHasone (DECADRON) tablet 12 mg: 12 mg | ORAL | @ 19:00:00 | Stop: 2021-02-26

## 2021-02-26 MED ADMIN — fosaprepitant (EMEND) 150 mg in sodium chloride (NS) 0.9 % 100 mL IVPB: 150 mg | INTRAVENOUS | @ 19:00:00 | Stop: 2021-02-26

## 2021-02-26 MED ADMIN — fluorouracil (ADRUCIL) 4,150 mg in sodium chloride (NS) 0.9 % 46-hr infusion CADD: 2400 mg/m2 | INTRAVENOUS | @ 21:00:00

## 2021-02-26 MED ADMIN — ondansetron (ZOFRAN) tablet 24 mg: 24 mg | ORAL | @ 19:00:00 | Stop: 2021-02-26

## 2021-02-26 MED ADMIN — irinotecan (CAMPTOSAR) 285.4 mg in dextrose 5 % 500 mL IVPB: 165 mg/m2 | INTRAVENOUS | @ 19:00:00 | Stop: 2021-02-26

## 2021-02-26 MED ADMIN — leucovorin 692 mg in dextrose 5 % 50 mL IVPB: 400 mg/m2 | INTRAVENOUS | @ 19:00:00 | Stop: 2021-02-26

## 2021-02-26 NOTE — Unmapped (Signed)
Dorcas Carrow with [facility] contacted the Communication Center regarding the following:    - States that she needs to speak with the care team regarding an additional question about Neurontin 300mg .    Please contact Delice Bison at [phone].    Thanks in advance,    Rosary Lively  [facility] Communication Center   [phone]

## 2021-02-26 NOTE — Unmapped (Signed)
Hi,     [facility] contacted the Communication Center requesting to speak with the care team of [place] White to discuss:    Wanting to confirm Gabapentin prescription instructions is correct for patient.     Please contact Fredric Mare at [phone].    Thank you,   Kelli Hope  [facility] Communication [facility]   [phone]

## 2021-02-26 NOTE — Unmapped (Signed)
Pharmacy called to confirm prescription for gabapentin. Previous prescription was for 300 mg tid and patient recently picked up 3 month supply. Confirmed new prescription and will make sure patient knows to take 2 tablets (600 mg) tid.

## 2021-02-26 NOTE — Unmapped (Unsigned)
Port accessed by [employer], labs drawn and sent.

## 2021-02-26 NOTE — Unmapped (Signed)
Labs found to be within parameters for treatment today. Request for drug sent to pharmacy.

## 2021-02-26 NOTE — Unmapped (Signed)
Met with patient while in clinic today. He reports to be doing well. 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 Letter drafted and given to patient. Patient also requested paperwork for handicap placard. Patient provided form with MD signature.

## 2021-02-26 NOTE — Unmapped (Signed)
Chemotherapy sequence adjusted for 5-FU bolus to occur after leucovorin (added to treatment plan starting tentatively in 2 weeks).    Care coordination: 5 minutes

## 2021-02-26 NOTE — Unmapped (Unsigned)
CT due today  Numbness and tingling not improving  Bowel movements - stable    Plan:  Increase to gabapentin    Service:  Hematology and Oncology  Patient Name: [place]  Patient Age: 60 y.o.  Encounter Date: 02/26/2021    PRIMARY CARE PHYSICIAN  Manuel Pen, MD  [address] [facility]  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 Large bowel obstruction (rectosigmoid obstructing mass), s/p loop diverting ostomy 08-03-20  3. Weight loss, stabilized and improving  4. Hearing loss reported with C5  5. Normocytic anemia  6. A fib with one episode SVT inpatient, no recurrence  7. Grade 1 neuropathy    - he is concerned about financial toxicity related to treatment; recommend ongoing and close follow up with social work and Physiological scientist  - he has deferred audiology referral due to cost  - colonoscopy; he would like to defer until next scans due to cost. He understands rationale.  - C 10 due today; discontinue oxaliplatin, de-escalate to 5-FU/LV/avastin after 12 cycles  - Zometa, calcium vit D, discuss at next visit  - imaging due first week April      _____________________________________________________________________   ONCOLOGY HISTORY  Diagnosed age 70 (Sept 2021) with metastatic colorectal cancer.  PMH is notable for kidney stones and A fib.  Reports intermittent diarrhea and abdominal pain since March 2021. CT 07-29-20 with colon mass.  He presented to [place] the weekend prior to planned palliative colonic stent for large bowel obstruction, and underwent flex sig with diverting loop colonoscopy 08-03-20.  Biopsy consistent with moderately differentiated adenocarcinoma (MSS). Staging CT ([facility]) notable for liver, lung and possible L iliac met.    INTERVAL HISTORY:  Accompanied by his wife. Peripheral neuropathy in hands is present most of the day, but is not limiting ADLs.  Denies: fevers, chills, CP, SOB, cough, abdominal pain, emesis, change in ostomy output, blood through ostomy output.   ??  A 10 point ROS was performed and negative except as noted above in the HPI.    _____________________________________________  OTHER PAST MEDICAL HISTORY / CURRENT PROBLEM LIST    Afib/SVT  HTN  MDD/Anxiety  BPH    Past Medical History:   Diagnosis Date   ??? BPH (benign prostatic hyperplasia)    ??? Colon cancer (CMS-HCC)    ??? Hypertension       _____________________________________________________________________  ALLERGIES    Patient has no known allergies.     _____________________________________________________________________  CURRENT MEDICATIONS    I am having [place] maintain his citalopram, docusate sodium, LORazepam, metoprolol succinate, fluticasone propionate, oxyCODONE, acetaminophen, multivitamin, loperamide, (heparin, porcine (PF)), sodium chloride, HYDROcodone-acetaminophen, SYSTANE BALANCE, ondansetron, promethazine, loratadine, polyethylene glycol, dexAMETHasone, tamsulosin, magnesium oxide, gabapentin, and ZIEXTENZO.     _____________________________________________________________________  HABITS    SOCIAL HISTORY  Social History     Socioeconomic History   ??? Marital status: Married     Spouse name: Not on file   ??? Number of children: Not on file   ??? Years of education: Not on file   ??? Highest education level: Not on file   Occupational History   ??? Not on file   Tobacco Use   ??? Smoking status: Never Smoker   ??? Smokeless tobacco: Never Used   Vaping Use   ??? Vaping Use: Never used   Substance and Sexual Activity   ??? Alcohol use: Never   ??? Drug use: Never   ??? Sexual activity: Not Currently   Other Topics Concern   ??? Not on file   Social History Narrative   ??? Not on file     Social Determinants of Health     Financial Resource Strain: High Risk   ??? Difficulty of Paying Living Expenses: Hard   Food Insecurity: No Food Insecurity   ??? Worried About [place] in the Last Year: Never true   ??? Ran Out of Food in the Last Year: Never true   Transportation Needs: No Transportation Needs   ??? Lack of Transportation (Medical): No   ??? Lack of Transportation (Non-Medical): No   Physical Activity: Not on file   Stress: Not on file   Social Connections: Not on file         FAMILY HISTORY  Mother- Uterine, Breast Ca, deceased     _____________________________________________________________________  PHYSICAL EXAM    VITAL SIGNS: There were no vitals taken for this visit.   Wt Readings from Last 3 Encounters:   02/12/21 61.2 kg (134 lb 14.4 oz)   01/29/21 61.7 kg (136 lb 0.4 oz)   01/29/21 61.7 kg (136 lb)   Gen: Thin man, sitting comfortably on couch, NAD  HEENT: PERRL, non-icteric sclera, no cervical or supraclavicular LAD  CV: RRR, no M/R/G, no LE edema  Pulm: CTAB  AB: +BS, soft, ND, ND, ostomy in place with normal output  Skin: no rash, lesions  Psych: normal affect,    _____________________________________________________________________  LABS    No visits with results within 1 Day(s) from this visit.   Latest known visit with results is:   Lab on 02/12/2021   Component Date Value   ??? Sodium 02/12/2021 142    ??? Potassium 02/12/2021 4.0    ??? Chloride 02/12/2021 106    ??? Anion Gap 02/12/2021 5    ??? CO2 02/12/2021 31.0    ??? BUN 02/12/2021 17 ??? Creatinine 02/12/2021 0.93    ??? BUN/Creatinine Ratio 02/12/2021 18    ??? EGFR CKD-EPI Non-African* 02/12/2021 89    ??? EGFR CKD-EPI African Ame* 02/12/2021 >90    ??? Glucose 02/12/2021 85    ??? Calcium 02/12/2021 8.8    ??? Albumin 02/12/2021 3.2 (A)   ??? Total Protein 02/12/2021 5.8    ??? Total Bilirubin 02/12/2021 0.3    ??? AST 02/12/2021 29    ??? ALT 02/12/2021 29    ??? Alkaline Phosphatase 02/12/2021 283 (A)   ??? Magnesium 02/12/2021 1.8    ??? CEA 02/12/2021 19.8 (A)   ??? WBC 02/12/2021 9.5    ??? RBC 02/12/2021 3.33 (A)   ??? HGB 02/12/2021 9.7 (A)   ??? HCT 02/12/2021 28.9 (A)   ??? MCV 02/12/2021 86.8    ??? [place] 02/12/2021 29.2    ??? MCHC 02/12/2021  33.6    ??? RDW 02/12/2021 22.5 (A)   ??? MPV 02/12/2021 7.6    ??? Platelet 02/12/2021 234    ??? Neutrophils % 02/12/2021 77.2    ??? Lymphocytes % 02/12/2021 12.0    ??? Monocytes % 02/12/2021 9.3    ??? Eosinophils % 02/12/2021 1.3    ??? Basophils % 02/12/2021 0.2    ??? Absolute Neutrophils 02/12/2021 7.3    ??? Absolute Lymphocytes 02/12/2021 1.1    ??? Absolute Monocytes 02/12/2021 0.9 (A)   ??? Absolute Eosinophils 02/12/2021 0.1    ??? Absolute Basophils 02/12/2021 0.0    ??? Anisocytosis 02/12/2021 Marked (A)   ??? Spec Gravity/POC 02/12/2021 >=1.030    ??? PH/POC 02/12/2021 6.0    ??? Leuk Esterase/POC 02/12/2021 Negative    ??? Nitrite/POC 02/12/2021 Negative    ??? Protein/POC 02/12/2021 Negative    ??? UA Glucose/POC 02/12/2021 Negative    ??? Ketones, POC 02/12/2021 Negative    ??? Bilirubin/POC 02/12/2021 Negative    ??? Blood/POC 02/12/2021 Negative    ??? Urobilinogen/POC 02/12/2021 0.2    ??? Smear Review Comments 02/12/2021 See Comment (A)   ??? Toxic Granulation 02/12/2021 Present (A)       RADIOLOGY / OTHER DIAGNOSTIC STUDIES  Flexible sigmoidoscopy 08-03-20:  Findings:       Hemorrhoids were found on perianal exam.       A fungating and completely obstructing large mass was found at 16 cm        proximal to the anus. No bleeding was present. Biopsies were taken with        a cold forceps for histology. Impression:            - Hemorrhoids found on perianal exam.                         - Likely malignant completely obstructing tumor at 16                          cm proximal to the anus. Biopsied.    PATHOLOGY  08-03-20:  Addendum   The purpose of this addendum is to report the results of immunohistochemical stains for mismatch repair proteins.    ??  Immunohistochemical stains for MLH1, MSH2, MSH6 and PMS2 were performed on block A1.  The tumor cells demonstrate appropriate nuclear expression for all four markers.  This is the normal phenotype and does NOT support a diagnosis of microsatellite instability/hereditary non-polyposis colorectal cancer (HNPCC).  Separate molecular testing for microsatellite instability markers will be performed by the [employer] (phone # [phone]) and reported separately.    ??  This result may have implications for therapy selection, as MSS/mismatch repair proficient tumors are less likely to respond to immune modulating agents like Pembrolizumab (1).  ??  1) Le DT, et al.?? [employer] J Med. 2015 Jun 25;372(26):2509-20. PMID: 24401027  ??  The original diagnoses are unchanged.  ??  Selected tissue block for possible future molecular pathology studies:  A1  ??  ??   Addendum electronically signed by Lyla Glassing, MD on 08/05/2020 at 1621   Final Diagnosis   A: Colon, rectosigmoid tumor, biopsy  - Invasive moderately differentiated adenocarcinoma associated with scant fragments of a tubulovillous adenoma with high-grade dysplasia  - No lymphovascular space invasion identified  - See comment         Imaging:  CT chest 11-23-20:  1. Numerous pulmonary nodules, involving all 5 lobes, most of which appear cavitary. Those nodules imaged on the previous CT abdomen demonstrate interval decreased size and increased cavitation since previous.  2. Mildly dilated ascending thoracic aorta.    CT AP 11-23-20:  Focal masslike thickening of the sigmoid colon consistent with known adenocarcinoma is mildly decreased in size.  ??  Interval left lower quadrant diverting loop sigmoid colostomy. Large colonic stool burden with no evidence of obstruction.  ??  Numerous hepatic metastasis are overall decreased in size and enhancement suggestive of treatment response.  ??  Stable partially calcified retroperitoneal lymphadenopathy.  ??  Unchanged mixed lytic and sclerotic left iliac lesion.  ??  No evidence of Manuel metastatic disease within the abdomen/pelvis.  ??    Bone scan 09-18-20:  Slight increased uptake in the medial aspect of the of the left iliac bone corresponding to the lucency surrounded by sclerotic change seen on CT dated 07/29/20.

## 2021-02-27 NOTE — Unmapped (Signed)
Lab on 02/26/2021   Component Date Value Ref Range Status   ??? Creatinine 02/26/2021 0.79  0.60 - 1.10 mg/dL Final   ??? EGFR CKD-EPI Non-African American,* 02/26/2021 >90  >=60 mL/min/1.39m2 Final   ??? EGFR CKD-EPI African American, Male 02/26/2021 >90  >=60 mL/min/1.33m2 Final   ??? Total Bilirubin 02/26/2021 0.3  0.3 - 1.2 mg/dL Final   ??? Potassium 16/08/9603 4.1  3.4 - 4.8 mmol/L Final   ??? Magnesium 02/26/2021 1.8  1.6 - 2.6 mg/dL Final   ??? WBC 54/07/8118 11.4 (A) 3.6 - 11.2 10*9/L Final   ??? RBC 02/26/2021 3.53 (A) 4.26 - 5.60 10*12/L Final   ??? HGB 02/26/2021 10.3 (A) 12.9 - 16.5 g/dL Final   ??? HCT 14/78/2956 31.7 (A) 39.0 - 48.0 % Final   ??? MCV 02/26/2021 89.7  77.6 - 95.7 fL Final   ??? MCH 02/26/2021 29.2  25.9 - 32.4 pg Final   ??? MCHC 02/26/2021 32.6  32.0 - 36.0 g/dL Final   ??? RDW 21/30/8657 23.3 (A) 12.2 - 15.2 % Final   ??? MPV 02/26/2021 7.9  6.8 - 10.7 fL Final   ??? Platelet 02/26/2021 188  150 - 450 10*9/L Final   ??? Neutrophils % 02/26/2021 80.2  % Final   ??? Lymphocytes % 02/26/2021 9.9  % Final   ??? Monocytes % 02/26/2021 7.2  % Final   ??? Eosinophils % 02/26/2021 2.3  % Final   ??? Basophils % 02/26/2021 0.4  % Final   ??? Absolute Neutrophils 02/26/2021 9.2 (A) 1.8 - 7.8 10*9/L Final   ??? Absolute Lymphocytes 02/26/2021 1.1  1.1 - 3.6 10*9/L Final   ??? Absolute Monocytes 02/26/2021 0.8  0.3 - 0.8 10*9/L Final   ??? Absolute Eosinophils 02/26/2021 0.3  0.0 - 0.5 10*9/L Final   ??? Absolute Basophils 02/26/2021 0.0  0.0 - 0.1 10*9/L Final   ??? Anisocytosis 02/26/2021 Marked (A) Not Present Final   ??? Spec Gravity/POC 02/26/2021 <=1.005  1.003 - 1.030 Final   ??? PH/POC 02/26/2021 6.5  5.0 - 9.0 Final   ??? Leuk Esterase/POC 02/26/2021 Negative  Negative Final   ??? Nitrite/POC 02/26/2021 Negative  Negative Final   ??? Protein/POC 02/26/2021 Negative  Negative Final   ??? UA Glucose/POC 02/26/2021 Negative  Negative Final   ??? Ketones, POC 02/26/2021 Negative  Negative Final   ??? Bilirubin/POC 02/26/2021 Negative  Negative Final   ??? Blood/POC 02/26/2021 Negative  Negative Final   ??? Urobilinogen/POC 02/26/2021 0.2  0.2 - 1.0 mg/dL Final   ??? Smear Review Comments 02/26/2021 See Comment (A) Undefined Final    Irregularly contracted RBCs present.     ??? Toxic Granulation 02/26/2021 Present (A) Not Present Final   ??? Poikilocytosis 02/26/2021 Moderate (A) Not Present Final

## 2021-02-27 NOTE — Unmapped (Signed)
Patient in clinic for treatment.  No new concerns or complaints.  Patient tolerated treatment without incident.

## 2021-03-02 NOTE — Unmapped (Signed)
Pt confirmed he's no longer taking Ziextenzo

## 2021-03-03 NOTE — Unmapped (Signed)
I spoke with spouse of patient [place] to confirm appointments on the following date(s): 03/25/21 at 3:00pm for CT scan.    [name]

## 2021-03-05 NOTE — Unmapped (Signed)
Outpatient Oncology Social Work  Follow Up       Psychosocial & Case Management Update:  SW reached out to Pt to inform him that his [employer] FA application was denied and the reason was because his income was over the limit. SW was informed that income is calculated by adding pay wages, Social Security wages, and bank accounts. SW informed Pt that a letter with the denial was mailed and in it includes the appeal process. Pt states that he will look for the letter and plans on appealing.    Pt confirmed that he started to receive his SSDI this month, he receives $1655/mo and also gets an additional $100 from long-term disability. He states that he is waiting for the SSDI award letter and is hoping to get it soon. He states that if he receives it, he will bring in a copy during his next appt.    Follow-Up Plan:   Pt has this SW's contact information and will reach out for assistance as needed.      Annice Pih, LCSW  Oncology Outpatient Social Worker  [phone]

## 2021-03-09 NOTE — Unmapped (Signed)
Specialty Medication(s): Ziextenzo    [place] has been dis-enrolled from the [facility] specialty pharmacy services due to medication discontinued.    Additional information provided to the patient: NA    [name] A [name] Specialty Pharmacist

## 2021-03-11 MED ORDER — DEXAMETHASONE 4 MG TABLET
ORAL_TABLET | 5 refills | 0 days
Start: 2021-03-11 — End: ?

## 2021-03-12 ENCOUNTER — Other Ambulatory Visit: Admit: 2021-03-12 | Discharge: 2021-03-13 | Payer: PRIVATE HEALTH INSURANCE

## 2021-03-12 ENCOUNTER — Encounter: Admit: 2021-03-12 | Discharge: 2021-03-13 | Payer: PRIVATE HEALTH INSURANCE

## 2021-03-12 DIAGNOSIS — C189 Malignant neoplasm of colon, unspecified: Principal | ICD-10-CM

## 2021-03-12 DIAGNOSIS — C787 Secondary malignant neoplasm of liver and intrahepatic bile duct: Principal | ICD-10-CM

## 2021-03-12 MED ORDER — DEXAMETHASONE 4 MG TABLET
ORAL_TABLET | 5 refills | 0 days | Status: CP
Start: 2021-03-12 — End: ?

## 2021-03-25 ENCOUNTER — Encounter: Admit: 2021-03-25 | Discharge: 2021-03-26 | Payer: PRIVATE HEALTH INSURANCE

## 2021-03-26 ENCOUNTER — Encounter: Admit: 2021-03-26 | Discharge: 2021-03-27 | Payer: PRIVATE HEALTH INSURANCE

## 2021-03-26 ENCOUNTER — Encounter
Admit: 2021-03-26 | Discharge: 2021-03-27 | Payer: PRIVATE HEALTH INSURANCE | Attending: Internal Medicine | Primary: Internal Medicine

## 2021-03-26 ENCOUNTER — Other Ambulatory Visit: Admit: 2021-03-26 | Discharge: 2021-03-27 | Payer: PRIVATE HEALTH INSURANCE

## 2021-03-26 DIAGNOSIS — C189 Malignant neoplasm of colon, unspecified: Principal | ICD-10-CM

## 2021-03-26 DIAGNOSIS — C787 Secondary malignant neoplasm of liver and intrahepatic bile duct: Principal | ICD-10-CM

## 2021-03-26 MED ORDER — GABAPENTIN 300 MG CAPSULE
ORAL_CAPSULE | Freq: Three times a day (TID) | ORAL | 3 refills | 90.00000 days | Status: CP
Start: 2021-03-26 — End: 2022-03-26

## 2021-03-26 MED ORDER — GABAPENTIN 100 MG CAPSULE
ORAL_CAPSULE | Freq: Three times a day (TID) | ORAL | 2 refills | 30 days | Status: CP
Start: 2021-03-26 — End: 2022-03-26

## 2021-04-16 ENCOUNTER — Encounter: Admit: 2021-04-16 | Discharge: 2021-04-17 | Payer: PRIVATE HEALTH INSURANCE

## 2021-04-16 DIAGNOSIS — C787 Secondary malignant neoplasm of liver and intrahepatic bile duct: Principal | ICD-10-CM

## 2021-04-16 DIAGNOSIS — C189 Malignant neoplasm of colon, unspecified: Principal | ICD-10-CM

## 2021-04-30 ENCOUNTER — Encounter: Admit: 2021-04-30 | Discharge: 2021-05-01 | Payer: PRIVATE HEALTH INSURANCE

## 2021-04-30 ENCOUNTER — Encounter
Admit: 2021-04-30 | Discharge: 2021-05-01 | Payer: PRIVATE HEALTH INSURANCE | Attending: Registered" | Primary: Registered"

## 2021-04-30 ENCOUNTER — Encounter
Admit: 2021-04-30 | Discharge: 2021-05-01 | Payer: PRIVATE HEALTH INSURANCE | Attending: Internal Medicine | Primary: Internal Medicine

## 2021-04-30 DIAGNOSIS — C189 Malignant neoplasm of colon, unspecified: Principal | ICD-10-CM

## 2021-04-30 DIAGNOSIS — C787 Secondary malignant neoplasm of liver and intrahepatic bile duct: Principal | ICD-10-CM

## 2021-04-30 MED ORDER — MAGNESIUM OXIDE 400 MG (241.3 MG MAGNESIUM) TABLET
ORAL_TABLET | Freq: Every day | ORAL | 1 refills | 240 days | Status: CP
Start: 2021-04-30 — End: 2022-04-30
  Filled 2021-04-30: qty 120, 120d supply, fill #0

## 2021-04-30 MED ORDER — GABAPENTIN 600 MG TABLET
ORAL_TABLET | Freq: Every day | ORAL | 2 refills | 90 days | Status: CP
Start: 2021-04-30 — End: 2022-04-30
  Filled 2021-04-30: qty 90, 90d supply, fill #0

## 2021-04-30 MED ORDER — GABAPENTIN 300 MG CAPSULE
ORAL_CAPSULE | Freq: Three times a day (TID) | ORAL | 3 refills | 90 days | Status: CP
Start: 2021-04-30 — End: 2021-04-30

## 2021-05-15 ENCOUNTER — Encounter: Admit: 2021-05-15 | Discharge: 2021-05-16 | Payer: PRIVATE HEALTH INSURANCE

## 2021-05-19 DIAGNOSIS — C787 Secondary malignant neoplasm of liver and intrahepatic bile duct: Principal | ICD-10-CM

## 2021-05-19 DIAGNOSIS — C189 Malignant neoplasm of colon, unspecified: Principal | ICD-10-CM

## 2021-05-27 ENCOUNTER — Ambulatory Visit: Admit: 2021-05-27 | Discharge: 2021-05-27 | Payer: PRIVATE HEALTH INSURANCE

## 2021-05-28 ENCOUNTER — Encounter: Admit: 2021-05-28 | Discharge: 2021-05-29 | Payer: PRIVATE HEALTH INSURANCE

## 2021-05-28 ENCOUNTER — Encounter
Admit: 2021-05-28 | Discharge: 2021-05-29 | Payer: PRIVATE HEALTH INSURANCE | Attending: Internal Medicine | Primary: Internal Medicine

## 2021-05-28 DIAGNOSIS — C787 Secondary malignant neoplasm of liver and intrahepatic bile duct: Principal | ICD-10-CM

## 2021-05-28 DIAGNOSIS — C189 Malignant neoplasm of colon, unspecified: Principal | ICD-10-CM

## 2021-05-29 MED ORDER — CITALOPRAM 10 MG TABLET
ORAL_TABLET | ORAL | 0 refills | 14 days | Status: CP
Start: 2021-05-29 — End: 2021-06-12

## 2021-05-31 ENCOUNTER — Institutional Professional Consult (permissible substitution): Admit: 2021-05-31 | Discharge: 2021-06-01 | Payer: PRIVATE HEALTH INSURANCE

## 2021-05-31 DIAGNOSIS — C189 Malignant neoplasm of colon, unspecified: Principal | ICD-10-CM

## 2021-05-31 DIAGNOSIS — C787 Secondary malignant neoplasm of liver and intrahepatic bile duct: Principal | ICD-10-CM

## 2021-06-05 MED ORDER — DULOXETINE 30 MG CAPSULE,DELAYED RELEASE
ORAL_CAPSULE | 3 refills | 0 days | Status: CP
Start: 2021-06-05 — End: ?

## 2021-06-11 ENCOUNTER — Encounter: Admit: 2021-06-11 | Discharge: 2021-06-12 | Payer: PRIVATE HEALTH INSURANCE

## 2021-06-11 ENCOUNTER — Other Ambulatory Visit: Admit: 2021-06-11 | Discharge: 2021-06-12 | Payer: PRIVATE HEALTH INSURANCE

## 2021-06-11 DIAGNOSIS — C787 Secondary malignant neoplasm of liver and intrahepatic bile duct: Principal | ICD-10-CM

## 2021-06-11 DIAGNOSIS — C189 Malignant neoplasm of colon, unspecified: Principal | ICD-10-CM

## 2021-06-25 ENCOUNTER — Encounter: Admit: 2021-06-25 | Discharge: 2021-06-26 | Payer: PRIVATE HEALTH INSURANCE

## 2021-06-25 ENCOUNTER — Ambulatory Visit: Admit: 2021-06-25 | Discharge: 2021-06-26 | Payer: PRIVATE HEALTH INSURANCE

## 2021-06-25 ENCOUNTER — Other Ambulatory Visit: Admit: 2021-06-25 | Discharge: 2021-06-26 | Payer: PRIVATE HEALTH INSURANCE

## 2021-06-25 DIAGNOSIS — C189 Malignant neoplasm of colon, unspecified: Principal | ICD-10-CM

## 2021-06-25 DIAGNOSIS — C787 Secondary malignant neoplasm of liver and intrahepatic bile duct: Principal | ICD-10-CM

## 2021-06-25 MED ORDER — HYDROCODONE 5 MG-ACETAMINOPHEN 325 MG TABLET
ORAL_TABLET | Freq: Four times a day (QID) | ORAL | 0 refills | 8 days | Status: CP | PRN
Start: 2021-06-25 — End: ?

## 2021-06-26 IMAGING — DX DG ABD PORTABLE 1V
1 series · 1 of 1 positions shown · non-contrast
Comparison: Radiograph 08/01/2020, CT 07/29/2020

CLINICAL DATA: NG tube

EXAM:
X-RAY ABDOMEN 1 VIEW

[abdomen kub]
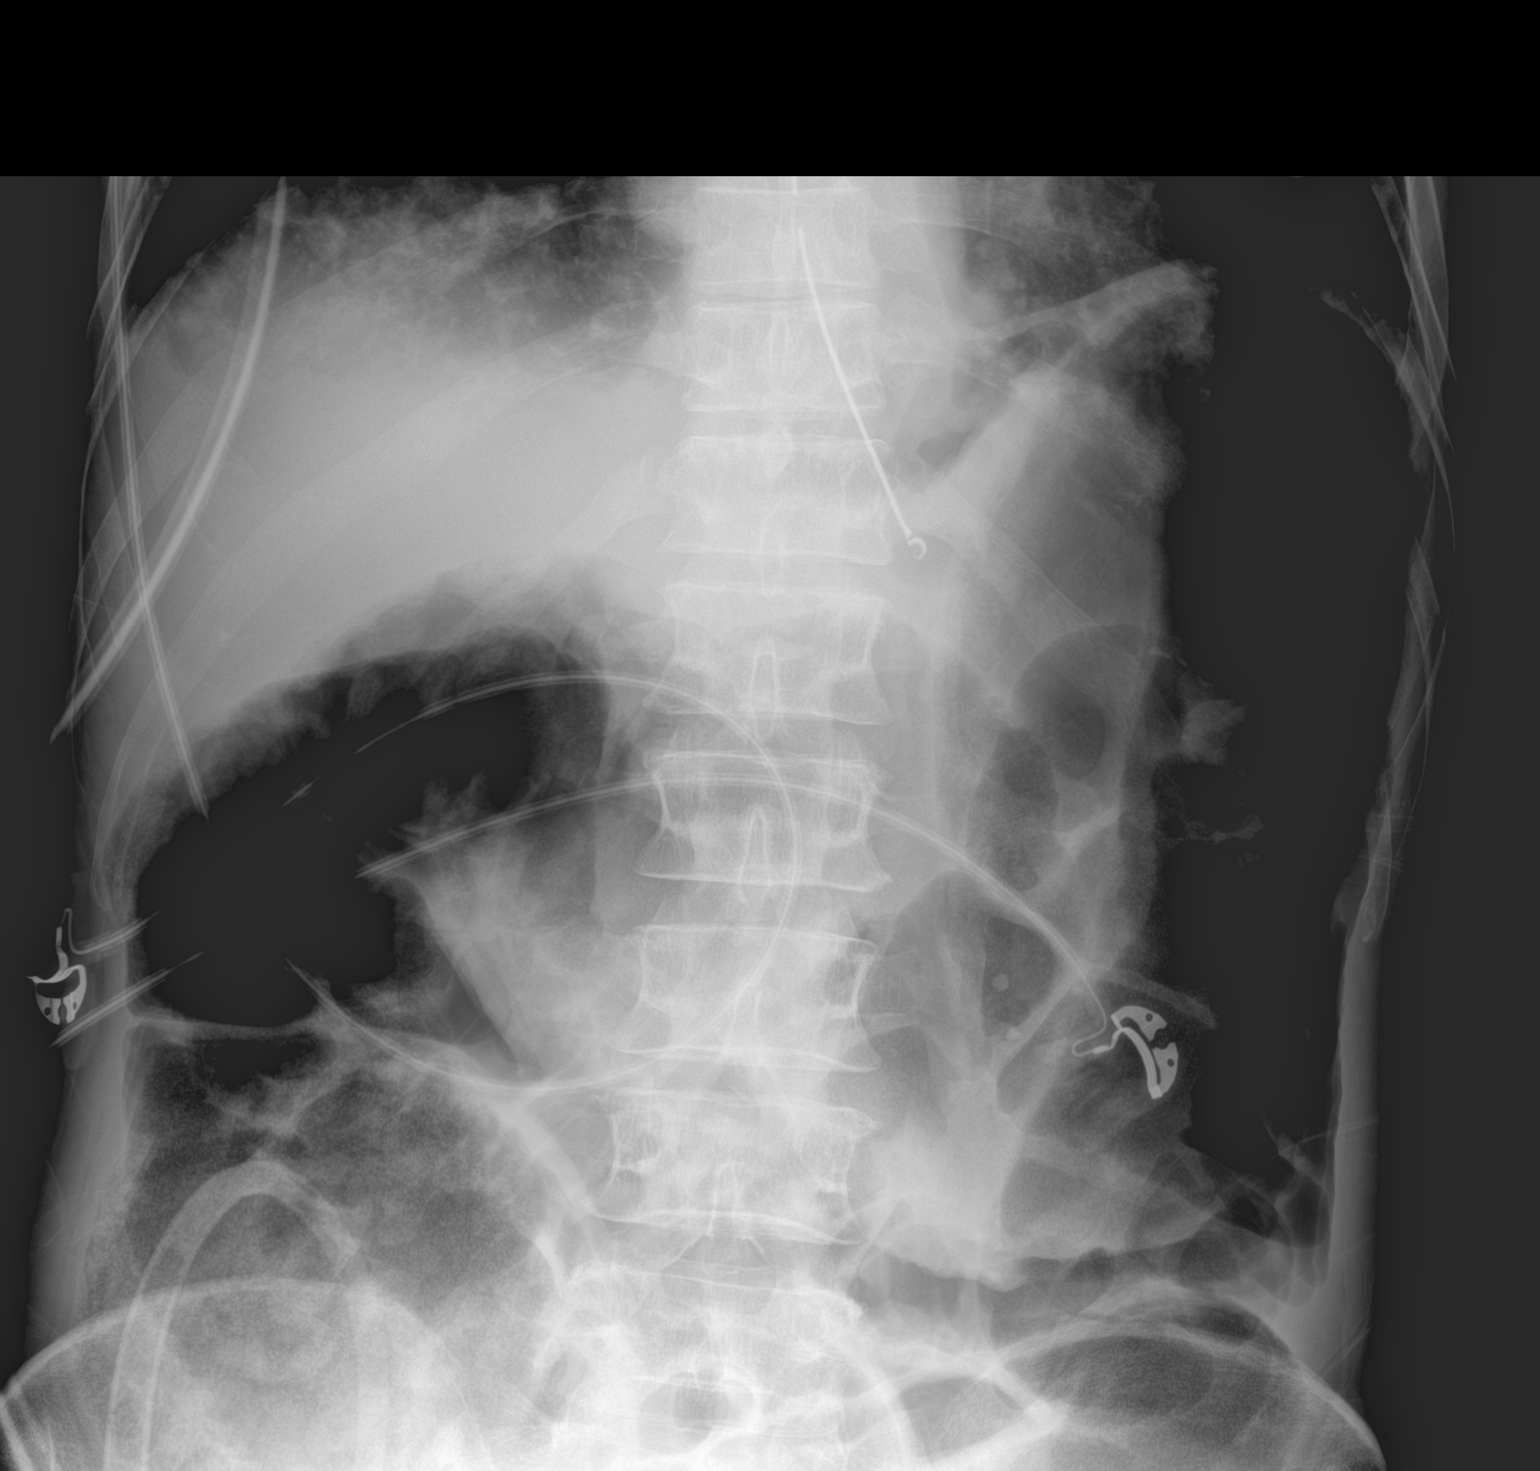

[1 of 1 positions shown; findings below may reference images not displayed]

FINDINGS: Transesophageal tube tip terminates in the vicinity of the GE
junction with the side port in the lower thoracic esophagus.
Redemonstration of the diffuse gaseous distension of both large and
small bowel compatible with a distal colonic obstruction seen on the
comparison CT. No subdiaphragmatic free air is seen on this upright
radiograph. Atelectatic changes are suspected in the lung bases,
poorly visualized given over penetrated exam. Osseous structures are
free of acute abnormality.
IMPRESSION: 1. Transesophageal tube tip terminates in the vicinity of the GE
junction with the side port in the lower thoracic esophagus.
Recommend advancing approximately 10-12 cm for optimal functioning.
2. Persistent diffuse gaseous distension of both large and small
bowel compatible with a distal colonic obstruction seen on the
comparison CT.

## 2021-07-08 MED ORDER — TAMSULOSIN 0.4 MG CAPSULE
ORAL_CAPSULE | Freq: Every day | ORAL | 0 refills | 90 days | Status: CP
Start: 2021-07-08 — End: ?

## 2021-07-08 MED ORDER — DULOXETINE 30 MG CAPSULE,DELAYED RELEASE
ORAL_CAPSULE | 3 refills | 0 days
Start: 2021-07-08 — End: ?

## 2021-07-09 ENCOUNTER — Ambulatory Visit: Admit: 2021-07-09 | Discharge: 2021-07-10 | Payer: PRIVATE HEALTH INSURANCE

## 2021-07-09 ENCOUNTER — Other Ambulatory Visit: Admit: 2021-07-09 | Discharge: 2021-07-09 | Payer: PRIVATE HEALTH INSURANCE

## 2021-07-09 DIAGNOSIS — C787 Secondary malignant neoplasm of liver and intrahepatic bile duct: Principal | ICD-10-CM

## 2021-07-09 DIAGNOSIS — C189 Malignant neoplasm of colon, unspecified: Principal | ICD-10-CM

## 2021-07-15 DIAGNOSIS — C189 Malignant neoplasm of colon, unspecified: Principal | ICD-10-CM

## 2021-07-15 DIAGNOSIS — C787 Secondary malignant neoplasm of liver and intrahepatic bile duct: Principal | ICD-10-CM

## 2021-07-23 ENCOUNTER — Ambulatory Visit: Admit: 2021-07-23 | Discharge: 2021-07-24 | Payer: PRIVATE HEALTH INSURANCE

## 2021-07-23 ENCOUNTER — Other Ambulatory Visit: Admit: 2021-07-23 | Discharge: 2021-07-24 | Payer: PRIVATE HEALTH INSURANCE

## 2021-07-23 ENCOUNTER — Ambulatory Visit
Admit: 2021-07-23 | Discharge: 2021-07-24 | Payer: PRIVATE HEALTH INSURANCE | Attending: Registered" | Primary: Registered"

## 2021-07-23 DIAGNOSIS — C787 Secondary malignant neoplasm of liver and intrahepatic bile duct: Principal | ICD-10-CM

## 2021-07-23 DIAGNOSIS — C189 Malignant neoplasm of colon, unspecified: Principal | ICD-10-CM

## 2021-07-23 MED ORDER — OXYCODONE 5 MG TABLET
ORAL_TABLET | Freq: Four times a day (QID) | ORAL | 0 refills | 8 days | Status: CP | PRN
Start: 2021-07-23 — End: ?

## 2021-08-02 MED ORDER — CITALOPRAM 10 MG TABLET
ORAL_TABLET | ORAL | 0 refills | 14 days
Start: 2021-08-02 — End: 2021-08-16

## 2021-08-05 MED ORDER — HYDROCODONE 5 MG-ACETAMINOPHEN 325 MG TABLET
ORAL_TABLET | Freq: Four times a day (QID) | ORAL | 0 refills | 8 days | Status: CN | PRN
Start: 2021-08-05 — End: ?

## 2021-08-06 ENCOUNTER — Ambulatory Visit
Admit: 2021-08-06 | Discharge: 2021-08-06 | Payer: PRIVATE HEALTH INSURANCE | Attending: Registered" | Primary: Registered"

## 2021-08-06 ENCOUNTER — Ambulatory Visit: Admit: 2021-08-06 | Discharge: 2021-08-06 | Payer: PRIVATE HEALTH INSURANCE

## 2021-08-06 ENCOUNTER — Other Ambulatory Visit: Admit: 2021-08-06 | Discharge: 2021-08-06 | Payer: PRIVATE HEALTH INSURANCE

## 2021-08-06 DIAGNOSIS — C189 Malignant neoplasm of colon, unspecified: Principal | ICD-10-CM

## 2021-08-06 DIAGNOSIS — C787 Secondary malignant neoplasm of liver and intrahepatic bile duct: Principal | ICD-10-CM

## 2021-08-09 MED ORDER — HYDROCODONE 5 MG-ACETAMINOPHEN 325 MG TABLET
ORAL_TABLET | Freq: Four times a day (QID) | ORAL | 0 refills | 8 days | Status: CP | PRN
Start: 2021-08-09 — End: ?

## 2021-08-19 ENCOUNTER — Ambulatory Visit: Admit: 2021-08-19 | Discharge: 2021-08-19 | Payer: PRIVATE HEALTH INSURANCE

## 2021-08-20 ENCOUNTER — Other Ambulatory Visit: Admit: 2021-08-20 | Discharge: 2021-08-20 | Payer: PRIVATE HEALTH INSURANCE

## 2021-08-20 ENCOUNTER — Ambulatory Visit: Admit: 2021-08-20 | Discharge: 2021-08-20 | Payer: PRIVATE HEALTH INSURANCE

## 2021-08-20 DIAGNOSIS — C189 Malignant neoplasm of colon, unspecified: Principal | ICD-10-CM

## 2021-08-20 DIAGNOSIS — C787 Secondary malignant neoplasm of liver and intrahepatic bile duct: Principal | ICD-10-CM

## 2021-08-20 MED ORDER — METOPROLOL SUCCINATE ER 50 MG TABLET,EXTENDED RELEASE 24 HR
ORAL_TABLET | Freq: Every day | ORAL | 3 refills | 30.00000 days | Status: CP
Start: 2021-08-20 — End: ?

## 2021-09-03 ENCOUNTER — Ambulatory Visit: Admit: 2021-09-03 | Discharge: 2021-09-03 | Payer: PRIVATE HEALTH INSURANCE

## 2021-09-03 ENCOUNTER — Other Ambulatory Visit: Admit: 2021-09-03 | Discharge: 2021-09-03 | Payer: PRIVATE HEALTH INSURANCE

## 2021-09-03 DIAGNOSIS — C787 Secondary malignant neoplasm of liver and intrahepatic bile duct: Principal | ICD-10-CM

## 2021-09-03 DIAGNOSIS — C189 Malignant neoplasm of colon, unspecified: Principal | ICD-10-CM

## 2021-09-03 MED ORDER — DOXYCYCLINE HYCLATE 100 MG CAPSULE
ORAL_CAPSULE | Freq: Two times a day (BID) | ORAL | 5 refills | 30.00000 days | Status: CP
Start: 2021-09-03 — End: ?
  Filled 2021-09-03: qty 60, 30d supply, fill #0

## 2021-09-17 ENCOUNTER — Other Ambulatory Visit: Admit: 2021-09-17 | Discharge: 2021-09-18 | Payer: PRIVATE HEALTH INSURANCE

## 2021-09-17 ENCOUNTER — Ambulatory Visit: Admit: 2021-09-17 | Discharge: 2021-09-18 | Payer: PRIVATE HEALTH INSURANCE

## 2021-09-17 ENCOUNTER — Ambulatory Visit: Admit: 2021-09-17 | Discharge: 2021-09-17 | Payer: PRIVATE HEALTH INSURANCE

## 2021-09-17 DIAGNOSIS — C787 Secondary malignant neoplasm of liver and intrahepatic bile duct: Principal | ICD-10-CM

## 2021-09-17 DIAGNOSIS — C189 Malignant neoplasm of colon, unspecified: Principal | ICD-10-CM

## 2021-09-23 MED ORDER — TRIAMCINOLONE ACETONIDE 0.1 % TOPICAL CREAM
Freq: Two times a day (BID) | TOPICAL | 0 refills | 0 days | Status: CP
Start: 2021-09-23 — End: 2022-09-23

## 2021-10-01 ENCOUNTER — Ambulatory Visit: Admit: 2021-10-01 | Discharge: 2021-10-02 | Payer: PRIVATE HEALTH INSURANCE

## 2021-10-01 ENCOUNTER — Other Ambulatory Visit: Admit: 2021-10-01 | Discharge: 2021-10-02 | Payer: PRIVATE HEALTH INSURANCE

## 2021-10-01 DIAGNOSIS — C189 Malignant neoplasm of colon, unspecified: Principal | ICD-10-CM

## 2021-10-01 DIAGNOSIS — C787 Secondary malignant neoplasm of liver and intrahepatic bile duct: Principal | ICD-10-CM

## 2021-10-01 MED ORDER — FAMOTIDINE 20 MG TABLET
ORAL_TABLET | Freq: Every evening | ORAL | 2 refills | 45 days | Status: CP
Start: 2021-10-01 — End: ?
  Filled 2021-10-01: qty 90, 45d supply, fill #0

## 2021-10-01 MED FILL — DOXYCYCLINE HYCLATE 100 MG CAPSULE: ORAL | 90 days supply | Qty: 180 | Fill #1

## 2021-10-12 DIAGNOSIS — C189 Malignant neoplasm of colon, unspecified: Principal | ICD-10-CM

## 2021-10-12 DIAGNOSIS — C787 Secondary malignant neoplasm of liver and intrahepatic bile duct: Principal | ICD-10-CM

## 2021-10-15 ENCOUNTER — Ambulatory Visit: Admit: 2021-10-15 | Discharge: 2021-10-16 | Payer: PRIVATE HEALTH INSURANCE

## 2021-10-20 ENCOUNTER — Institutional Professional Consult (permissible substitution)
Admit: 2021-10-20 | Discharge: 2021-10-21 | Payer: PRIVATE HEALTH INSURANCE | Attending: Registered" | Primary: Registered"

## 2021-10-21 ENCOUNTER — Ambulatory Visit: Admit: 2021-10-21 | Payer: PRIVATE HEALTH INSURANCE | Attending: Radiation Oncology | Primary: Radiation Oncology

## 2021-10-26 MED ORDER — MIRTAZAPINE 15 MG DISINTEGRATING TABLET
ORAL_TABLET | Freq: Every evening | ORAL | 2 refills | 30 days | Status: CP
Start: 2021-10-26 — End: 2022-10-26

## 2021-10-26 MED ORDER — BENZONATATE 100 MG CAPSULE
ORAL_CAPSULE | Freq: Three times a day (TID) | ORAL | 0 refills | 10.00000 days | Status: CP | PRN
Start: 2021-10-26 — End: 2021-11-02

## 2021-10-27 DIAGNOSIS — C787 Secondary malignant neoplasm of liver and intrahepatic bile duct: Principal | ICD-10-CM

## 2021-10-27 DIAGNOSIS — C189 Malignant neoplasm of colon, unspecified: Principal | ICD-10-CM

## 2021-10-27 MED ORDER — AMOXICILLIN 875 MG-POTASSIUM CLAVULANATE 125 MG TABLET
ORAL_TABLET | Freq: Two times a day (BID) | ORAL | 0 refills | 5.00000 days | Status: CP
Start: 2021-10-27 — End: 2021-11-01
  Filled 2021-10-27: qty 10, 5d supply, fill #0

## 2021-10-28 ENCOUNTER — Ambulatory Visit: Admit: 2021-10-28 | Discharge: 2021-10-28 | Payer: PRIVATE HEALTH INSURANCE

## 2021-10-28 ENCOUNTER — Other Ambulatory Visit: Admit: 2021-10-28 | Discharge: 2021-10-28 | Payer: PRIVATE HEALTH INSURANCE

## 2021-10-29 ENCOUNTER — Ambulatory Visit: Admit: 2021-10-29 | Discharge: 2021-10-30 | Payer: PRIVATE HEALTH INSURANCE

## 2021-10-29 ENCOUNTER — Ambulatory Visit
Admit: 2021-10-29 | Discharge: 2021-10-30 | Payer: PRIVATE HEALTH INSURANCE | Attending: Registered" | Primary: Registered"

## 2021-10-29 ENCOUNTER — Ambulatory Visit
Admit: 2021-10-29 | Discharge: 2021-10-30 | Payer: PRIVATE HEALTH INSURANCE | Attending: Student in an Organized Health Care Education/Training Program | Primary: Student in an Organized Health Care Education/Training Program

## 2021-10-29 ENCOUNTER — Other Ambulatory Visit: Admit: 2021-10-29 | Discharge: 2021-10-30 | Payer: PRIVATE HEALTH INSURANCE

## 2021-10-29 DIAGNOSIS — G893 Neoplasm related pain (acute) (chronic): Principal | ICD-10-CM

## 2021-10-29 DIAGNOSIS — K5903 Drug induced constipation: Principal | ICD-10-CM

## 2021-10-29 DIAGNOSIS — Z515 Encounter for palliative care: Principal | ICD-10-CM

## 2021-10-29 DIAGNOSIS — C787 Secondary malignant neoplasm of liver and intrahepatic bile duct: Principal | ICD-10-CM

## 2021-10-29 DIAGNOSIS — C189 Malignant neoplasm of colon, unspecified: Principal | ICD-10-CM

## 2021-10-29 DIAGNOSIS — Z7189 Other specified counseling: Principal | ICD-10-CM

## 2021-10-29 DIAGNOSIS — R630 Anorexia: Principal | ICD-10-CM

## 2021-10-29 MED ORDER — PROCHLORPERAZINE MALEATE 10 MG TABLET
ORAL_TABLET | Freq: Four times a day (QID) | ORAL | 3 refills | 15 days | Status: CP | PRN
Start: 2021-10-29 — End: ?
  Filled 2021-10-29: qty 60, 15d supply, fill #0

## 2021-10-29 MED ORDER — NAPROXEN 500 MG TABLET
ORAL_TABLET | Freq: Two times a day (BID) | ORAL | 0 refills | 5 days | Status: CP
Start: 2021-10-29 — End: 2021-11-03
  Filled 2021-10-29: qty 10, 5d supply, fill #0

## 2021-10-29 MED ORDER — OLANZAPINE 5 MG TABLET
ORAL_TABLET | Freq: Every evening | ORAL | 2 refills | 60 days | Status: CP
Start: 2021-10-29 — End: 2022-10-29
  Filled 2021-10-29: qty 30, 60d supply, fill #0

## 2021-11-01 DIAGNOSIS — C189 Malignant neoplasm of colon, unspecified: Principal | ICD-10-CM

## 2021-11-01 DIAGNOSIS — C787 Secondary malignant neoplasm of liver and intrahepatic bile duct: Principal | ICD-10-CM

## 2021-11-21 DEATH — deceased

## 2021-11-23 ENCOUNTER — Ambulatory Visit: Admit: 2021-11-23 | Payer: PRIVATE HEALTH INSURANCE

## 2021-12-22 ENCOUNTER — Ambulatory Visit: Admit: 2021-12-22
